# Patient Record
Sex: Female | Born: 1995 | Hispanic: Yes | Marital: Single | Smoking: Never smoker
Health system: Southern US, Community
[De-identification: ages and names within clinical notes are randomized; demographics above are authoritative.]

## PROBLEM LIST (undated history)

## (undated) DIAGNOSIS — F419 Anxiety disorder, unspecified: Secondary | ICD-10-CM

## (undated) DIAGNOSIS — E079 Disorder of thyroid, unspecified: Secondary | ICD-10-CM

## (undated) HISTORY — DX: Disorder of thyroid, unspecified: E07.9

## (undated) HISTORY — DX: Anxiety disorder, unspecified: F41.9

---

## 2017-10-08 ENCOUNTER — Emergency Department (HOSPITAL_COMMUNITY): Payer: Self-pay

## 2017-10-08 ENCOUNTER — Emergency Department (HOSPITAL_COMMUNITY)
Admission: EM | Admit: 2017-10-08 | Discharge: 2017-10-08 | Disposition: A | Discharge status: Home / Self Care | Payer: Self-pay | Attending: Emergency Medicine | Admitting: Emergency Medicine

## 2017-10-08 ENCOUNTER — Other Ambulatory Visit: Payer: Self-pay

## 2017-10-08 ENCOUNTER — Encounter (HOSPITAL_COMMUNITY): Payer: Self-pay | Admitting: Emergency Medicine

## 2017-10-08 DIAGNOSIS — J069 Acute upper respiratory infection, unspecified: Secondary | ICD-10-CM | POA: Insufficient documentation

## 2017-10-08 DIAGNOSIS — B9789 Other viral agents as the cause of diseases classified elsewhere: Secondary | ICD-10-CM

## 2017-10-08 MED ORDER — BENZONATATE 100 MG PO CAPS: 100.0000 mg | ORAL_CAPSULE | Freq: Three times a day (TID) | ORAL | 0 refills | Status: DC | PRN

## 2017-10-08 MED ORDER — HYDROCODONE-HOMATROPINE 5-1.5 MG/5ML PO SYRP: 5.0000 mL | ORAL_SOLUTION | Freq: Four times a day (QID) | ORAL | 0 refills | Status: DC | PRN

## 2017-10-08 NOTE — ED Provider Notes (Signed)
[facility] EMERGENCY DEPARTMENT Provider Note   CSN: [id number] Arrival date & time: 10/08/17  0141     History   Chief Complaint Chief Complaint  Patient presents with  . Cough    HPI Erika Stein is a 22 y.o. female.  Patient presents to the emergency department for evaluation of cough.  She has been sick for 4 or 5 days.  She has had some mild nasal congestion as well.  Patient reports that the cough gets worse at night she has been unable to sleep.  She does not have a history of asthma.  She has not had a fever.  No nausea, vomiting or diarrhea.      History reviewed. No pertinent past medical history.  There are no active problems to display for this patient.   History reviewed. No pertinent surgical history.  OB History    No data available       Home Medications    Prior to Admission medications   Medication Sig Start Date End Date Taking? Authorizing Provider  levothyroxine (SYNTHROID, LEVOTHROID) 25 MCG tablet Take 25 mcg by mouth daily before breakfast.   Yes [provider]  benzonatate (TESSALON) 100 MG capsule Take 1 capsule (100 mg total) by mouth 3 (three) times daily as needed for cough. 10/08/17   Nhi Butrum, Canary Brim, MD  HYDROcodone-homatropine (HYCODAN) 5-1.5 MG/5ML syrup Take 5 mLs by mouth every 6 (six) hours as needed for cough. 10/08/17   Gilda Crease, MD    Family History No family history on file.  Social History Social History   Tobacco Use  . Smoking status: Never Smoker  . Smokeless tobacco: Never Used  Substance Use Topics  . Alcohol use: Not on file  . Drug use: Not on file     Allergies   Patient has no allergy information on record.   Review of Systems Review of Systems  Respiratory: Positive for cough.   All other systems reviewed and are negative.    Physical Exam Updated Vital Signs BP 121/60 (BP Location: Left Arm)   Pulse 77   Temp 98.5 F (36.9 C) (Oral)   Resp 16   Wt (P) 48.5 kg  (107 lb)   LMP 08/10/2017 (Within Weeks) Comment: states irregular periods  SpO2 100%   Physical Exam  Constitutional: She is oriented to person, place, and time. She appears well-developed and well-nourished. No distress.  HENT:  Head: Normocephalic and atraumatic.  Right Ear: Hearing normal.  Left Ear: Hearing normal.  Nose: Nose normal.  Mouth/Throat: Oropharynx is clear and moist and mucous membranes are normal.  Eyes: Conjunctivae and EOM are normal. Pupils are equal, round, and reactive to light.  Neck: Normal range of motion. Neck supple.  Cardiovascular: Regular rhythm, S1 normal and S2 normal. Exam reveals no gallop and no friction rub.  No murmur heard. Pulmonary/Chest: Effort normal and breath sounds normal. No respiratory distress. She exhibits no tenderness.  Abdominal: Soft. Normal appearance and bowel sounds are normal. There is no hepatosplenomegaly. There is no tenderness. There is no rebound, no guarding, no tenderness at McBurney's point and negative Murphy's sign. No hernia.  Musculoskeletal: Normal range of motion.  Neurological: She is alert and oriented to person, place, and time. She has normal strength. No cranial nerve deficit or sensory deficit. Coordination normal. GCS eye subscore is 4. GCS verbal subscore is 5. GCS motor subscore is 6.  Skin: Skin is warm, dry and intact. No rash noted. No cyanosis.  Psychiatric: She has a normal mood and affect. Her speech is normal and behavior is normal. Thought content normal.  Nursing note and vitals reviewed.    ED Treatments / Results  Labs (all labs ordered are listed, but only abnormal results are displayed) Labs Reviewed - No data to display  EKG  EKG Interpretation None       Radiology Dg Chest 2 View  Result Date: 10/08/2017 CLINICAL DATA:  22 year old female with cough. EXAM: CHEST - 2 VIEW COMPARISON:  None. FINDINGS: The heart size and mediastinal contours are within normal limits. Both lungs are  clear. The visualized skeletal structures are unremarkable. IMPRESSION: No active cardiopulmonary disease. Electronically Signed   By: Elgie CollardArash  Radparvar M.D.   On: 10/08/2017 03:29    Procedures Procedures (including critical care time)  Medications Ordered in ED Medications - No data to display   Initial Impression / Assessment and Plan / ED Course  I have reviewed the triage vital signs and the nursing notes.  Pertinent labs & imaging results that were available during my care of the patient were reviewed by me and considered in my medical decision making (see chart for details).     Patient presents for evaluation of cough and chest congestion.  Symptoms ongoing for 4 or 5 days.  Chest x-ray is clear.  Lung examination is clear, no evidence of bronchospasm.  She has not had any difficulty breathing, oxygenation is normal.  Will treat symptomatically for upper respiratory infection.  Final Clinical Impressions(s) / ED Diagnoses   Final diagnoses:  Viral URI with cough    ED Discharge Orders        Ordered    HYDROcodone-homatropine (HYCODAN) 5-1.5 MG/5ML syrup  Every 6 hours PRN     10/08/17 0401    benzonatate (TESSALON) 100 MG capsule  3 times daily PRN     10/08/17 0401       Gilda CreasePollina, Zaden Sako J, MD 10/08/17 0401

## 2017-10-08 NOTE — ED Triage Notes (Signed)
Pt C/O cough that started 4 days ago. Pt states it is worse at night.

## 2017-10-16 ENCOUNTER — Encounter: Payer: Self-pay | Admitting: Physician Assistant

## 2017-10-16 ENCOUNTER — Ambulatory Visit: Payer: Self-pay | Admitting: Physician Assistant

## 2017-10-16 VITALS — BP 107/60 | HR 88 | Temp 99.0°F | Ht 58.5 in | Wt 109.8 lb

## 2017-10-16 DIAGNOSIS — E039 Hypothyroidism, unspecified: Secondary | ICD-10-CM

## 2017-10-16 DIAGNOSIS — Z7689 Persons encountering health services in other specified circumstances: Secondary | ICD-10-CM

## 2017-10-16 DIAGNOSIS — R058 Other specified cough: Secondary | ICD-10-CM

## 2017-10-16 DIAGNOSIS — Z13220 Encounter for screening for lipoid disorders: Secondary | ICD-10-CM

## 2017-10-16 DIAGNOSIS — R05 Cough: Secondary | ICD-10-CM

## 2017-10-16 MED ORDER — AZITHROMYCIN 250 MG PO TABS: ORAL_TABLET | ORAL | 0 refills | Status: DC

## 2017-10-16 NOTE — Progress Notes (Signed)
BP 107/60 (BP Location: Right Arm, Patient Position: Sitting, Cuff Size: Normal)   Pulse 88   Temp 99 F (37.2 C)   Ht 4' 10.5" (1.486 m)   Wt 109 lb 12 oz (49.8 kg)   SpO2 99%   BMI 22.55 kg/m    Subjective:    Patient ID: Erika Stein, female    DOB: 01/19/1996, 22 y.o.   MRN: [id number]  HPI: Erika Stein is a 22 y.o. female presenting on 10/16/2017 for New Patient (Initial Visit) and Cough (started almost 3 weeks ago. pt has taken tessalon but states it made her cough worse. pt states she is now taking robitussin which helps a little.)   HPI   Chief Complaint  Patient presents with  . New Patient (Initial Visit)  . Cough    started almost 3 weeks ago. pt has taken tessalon but states it made her cough worse. pt states she is now taking robitussin which helps a little.    Pt [facility] pt [facility] but she wants to transfer to here.   She started on thyroid medication when she was 19 or so.  Pt seen in ER recently and given hycodan and tessalon.  She says the tessalon didn't help and they told her not to use the hycodan.  She has been coughing for over 2 weeks.   CXR report from earlier this week reviewed  Pt has never had a PAP.  She was on OCP in the past but has stopped it.  She is not sexually active  Relevant past medical, surgical, family and social history reviewed and updated as indicated. Interim medical history since our last visit reviewed. Allergies and medications reviewed and updated.  CURRENT MEDS:  Levothyroxine 25mcg  Review of Systems  Constitutional: Positive for appetite change and fatigue. Negative for chills, diaphoresis, fever and unexpected weight change.  HENT: Positive for congestion, ear pain, sneezing, sore throat and trouble swallowing. Negative for dental problem, drooling, facial swelling, hearing loss, mouth sores and voice change.   Eyes: Negative for pain, discharge, redness, itching and visual disturbance.  Respiratory: Positive for  choking. Negative for cough, shortness of breath and wheezing.   Cardiovascular: Positive for chest pain. Negative for palpitations and leg swelling.  Gastrointestinal: Positive for abdominal pain. Negative for blood in stool, constipation, diarrhea and vomiting.  Endocrine: Negative for cold intolerance, heat intolerance and polydipsia.  Genitourinary: Negative for decreased urine volume, dysuria and hematuria.  Musculoskeletal: Positive for back pain. Negative for arthralgias and gait problem.  Skin: Negative for rash.  Allergic/Immunologic: Negative for environmental allergies.  Neurological: Negative for seizures, syncope, light-headedness and headaches.  Hematological: Negative for adenopathy.  Psychiatric/Behavioral: Negative for agitation, dysphoric mood and suicidal ideas. The patient is not nervous/anxious.     Per HPI unless specifically indicated above     Objective:    BP 107/60 (BP Location: Right Arm, Patient Position: Sitting, Cuff Size: Normal)   Pulse 88   Temp 99 F (37.2 C)   Ht 4' 10.5" (1.486 m)   Wt 109 lb 12 oz (49.8 kg)   SpO2 99%   BMI 22.55 kg/m   Wt Readings from Last 3 Encounters:  10/16/17 109 lb 12 oz (49.8 kg)  10/08/17 (P) 107 lb (48.5 kg)    Physical Exam  Constitutional: She is oriented to person, place, and time. She appears well-developed and well-nourished. No distress.  HENT:  Head: Normocephalic and atraumatic.  Right Ear: Hearing, tympanic membrane, external ear  and ear canal normal.  Left Ear: Hearing, tympanic membrane, external ear and ear canal normal.  Nose: Rhinorrhea present. No mucosal edema.  Mouth/Throat: Uvula is midline and oropharynx is clear and moist. No oropharyngeal exudate.  Eyes: Pupils are equal, round, and reactive to light. Conjunctivae and EOM are normal.  Neck: Neck supple. No thyromegaly present.  Cardiovascular: Normal rate and regular rhythm.  Pulmonary/Chest: Effort normal and breath sounds normal. No  respiratory distress. She has no decreased breath sounds. She has no wheezes. She has no rhonchi. She has no rales.  Abdominal: Soft. Bowel sounds are normal. She exhibits no mass. There is no hepatosplenomegaly. There is no tenderness.  Musculoskeletal: She exhibits no edema.  Lymphadenopathy:    She has no cervical adenopathy.  Neurological: She is alert and oriented to person, place, and time. Gait normal.  Skin: Skin is warm and dry.  Psychiatric: She has a normal mood and affect. Her behavior is normal.  Vitals reviewed.   No results found for this or any previous visit.    Assessment & Plan:   Encounter Diagnoses  Name Primary?  . Encounter to establish care Yes  . Cough present for greater than 3 weeks   . Hypothyroidism, unspecified type   . Screening cholesterol level      -will check baseline labs -pt to continue current levothyroxine -rx zpack -pt counseled to use the hycodan if needed.  Cautioned to not drive while taking it due to sedation.  She can use robitussin or tessalon at other times.  Also recommended benadryl or similar for nasal congestion -pt to follow up in 6 months.  RTO sooner if cough persists or for other issues

## 2017-10-17 ENCOUNTER — Other Ambulatory Visit (HOSPITAL_COMMUNITY)
Admission: RE | Admit: 2017-10-17 | Discharge: 2017-10-17 | Disposition: A | Discharge status: Home / Self Care | Payer: Self-pay | Source: Ambulatory Visit | Attending: Physician Assistant | Admitting: Physician Assistant

## 2017-10-17 DIAGNOSIS — Z13220 Encounter for screening for lipoid disorders: Secondary | ICD-10-CM | POA: Insufficient documentation

## 2017-10-17 DIAGNOSIS — E039 Hypothyroidism, unspecified: Secondary | ICD-10-CM | POA: Insufficient documentation

## 2017-10-17 LAB — LIPID PANEL
CHOLESTEROL: 126 mg/dL (ref 0–200)
HDL: 57 mg/dL (ref >40)
LDL CALC: 57 mg/dL (ref 0–99)
TRIGLYCERIDES: 61 mg/dL (ref <150)
Total CHOL/HDL Ratio: 2.2 RATIO
VLDL: 12 mg/dL (ref 0–40)

## 2017-10-17 LAB — COMPREHENSIVE METABOLIC PANEL WITH GFR
ALT: 16 U/L (ref 14–54)
AST: 16 U/L (ref 15–41)
Albumin: 3.9 g/dL (ref 3.5–5.0)
Alkaline Phosphatase: 83 U/L (ref 38–126)
Anion gap: 11 (ref 5–15)
BUN: 9 mg/dL (ref 6–20)
CO2: 26 mmol/L (ref 22–32)
Calcium: 9 mg/dL (ref 8.9–10.3)
Chloride: 102 mmol/L (ref 101–111)
Creatinine, Ser: 0.6 mg/dL (ref 0.44–1.00)
GFR calc Af Amer: >60 mL/min (ref >60)
GFR calc non Af Amer: >60 mL/min (ref >60)
Glucose, Bld: 92 mg/dL (ref 65–99)
Potassium: 4.7 mmol/L (ref 3.5–5.1)
Sodium: 139 mmol/L (ref 135–145)
Total Bilirubin: 0.7 mg/dL (ref 0.3–1.2)
Total Protein: 7.2 g/dL (ref 6.5–8.1)

## 2017-10-17 LAB — TSH: TSH: 2.644 u[IU]/mL (ref 0.350–4.500)

## 2018-01-15 ENCOUNTER — Other Ambulatory Visit: Payer: Self-pay | Admitting: Physician Assistant

## 2018-01-15 MED ORDER — LEVOTHYROXINE SODIUM 25 MCG PO TABS: ORAL_TABLET | ORAL | 3 refills | Status: DC

## 2018-01-28 ENCOUNTER — Ambulatory Visit: Payer: Self-pay | Admitting: Physician Assistant

## 2018-01-28 ENCOUNTER — Encounter: Payer: Self-pay | Admitting: Physician Assistant

## 2018-01-28 VITALS — BP 91/56 | HR 72 | Temp 97.9°F | Wt 111.2 lb

## 2018-01-28 DIAGNOSIS — J029 Acute pharyngitis, unspecified: Secondary | ICD-10-CM

## 2018-01-28 DIAGNOSIS — L709 Acne, unspecified: Secondary | ICD-10-CM

## 2018-01-28 NOTE — Patient Instructions (Addendum)

## 2018-01-28 NOTE — Progress Notes (Signed)
BP (!) 91/56 (BP Location: Right Arm, Patient Position: Sitting, Cuff Size: Normal)   Pulse 72   Temp 97.9 F (36.6 C)   Wt 111 lb 4 oz (50.5 kg)   SpO2 100%   BMI 22.86 kg/m    Subjective:    Patient ID: Erika Stein, female    DOB: October 16, 1995, 22 y.o.   MRN: [id number]  HPI: Erika Stein is a 22 y.o. female presenting on 01/28/2018 for Sore Throat (began about a week ago. intermittent R ear pain. painful to swollow. pt has not tried anything) and Acne (pt states she usually break out during her period. pt states bumps are more than usual. pt also notices white spots on face)   HPI   Chief Complaint  Patient presents with  . Sore Throat    began about a week ago. intermittent R ear pain. painful to swollow. pt has not tried anything  . Acne    pt states she usually break out during her period. pt states bumps are more than usual. pt also notices white spots on face   No fever. No cough or congestion  Pt notes breaking out past 2 weeks or so.  She thinks its more related to her menses.  LMP June 11.  She used to be on OCP but has been off that about 6 months.  She is currently not sexually active  Relevant past medical, surgical, family and social history reviewed and updated as indicated. Interim medical history since our last visit reviewed. Allergies and medications reviewed and updated.   Current Outpatient Medications:  .  levothyroxine (SYNTHROID, LEVOTHROID) 25 MCG tablet, 1 po qd.  Tome una tableta por boca diaria, Disp: 30 tablet, Rfl: 3   Review of Systems  Constitutional: Positive for appetite change and fatigue. Negative for chills, diaphoresis, fever and unexpected weight change.  HENT: Positive for sore throat. Negative for congestion, dental problem, drooling, ear pain, facial swelling, hearing loss, mouth sores, sneezing, trouble swallowing and voice change.   Eyes: Negative for pain, discharge, redness, itching and visual disturbance.  Respiratory:  Negative for cough, choking, shortness of breath and wheezing.   Cardiovascular: Negative for chest pain, palpitations and leg swelling.  Gastrointestinal: Negative for abdominal pain, blood in stool, constipation, diarrhea and vomiting.  Endocrine: Negative for cold intolerance, heat intolerance and polydipsia.  Genitourinary: Negative for decreased urine volume, dysuria and hematuria.  Musculoskeletal: Negative for arthralgias, back pain and gait problem.  Skin: Negative for rash.  Allergic/Immunologic: Negative for environmental allergies.  Neurological: Negative for seizures, syncope, light-headedness and headaches.  Hematological: Negative for adenopathy.  Psychiatric/Behavioral: Negative for agitation, dysphoric mood and suicidal ideas. The patient is not nervous/anxious.     Per HPI unless specifically indicated above     Objective:    BP (!) 91/56 (BP Location: Right Arm, Patient Position: Sitting, Cuff Size: Normal)   Pulse 72   Temp 97.9 F (36.6 C)   Wt 111 lb 4 oz (50.5 kg)   SpO2 100%   BMI 22.86 kg/m   Wt Readings from Last 3 Encounters:  01/28/18 111 lb 4 oz (50.5 kg)  10/16/17 109 lb 12 oz (49.8 kg)  10/08/17 (P) 107 lb (48.5 kg)    Physical Exam  Constitutional: She is oriented to person, place, and time. She appears well-developed and well-nourished.  HENT:  Head: Normocephalic and atraumatic.  Right Ear: Hearing, tympanic membrane, external ear and ear canal normal.  Left  Ear: Hearing, tympanic membrane, external ear and ear canal normal.  Nose: Nose normal.  Mouth/Throat: Uvula is midline and oropharynx is clear and moist. No oropharyngeal exudate.  Neck: Neck supple.  Cardiovascular: Normal rate and regular rhythm.  Pulmonary/Chest: Effort normal and breath sounds normal. She has no wheezes.  Abdominal: Soft. Bowel sounds are normal. She exhibits no mass. There is no hepatosplenomegaly. There is no tenderness.  Musculoskeletal: She exhibits no edema.   Lymphadenopathy:    She has no cervical adenopathy.  Neurological: She is alert and oriented to person, place, and time.  Skin: Skin is warm and dry.  Some comedones on forehead and cheeks  Psychiatric: She has a normal mood and affect. Her behavior is normal.  Vitals reviewed.       Assessment & Plan:    Encounter Diagnoses  Name Primary?  . Sore throat Yes  . Acne, unspecified acne type     -encouraged warm salt water gargles for sore throat -discussed that since acne just a recent issue and she has tried no OTCs, that is the way to go.  Recommend washing with mild cleanser like cetaphil and an OTC bp like clearasil. -pt to follow up in September as scheduled.  RTO sooner if no improvement

## 2018-01-29 ENCOUNTER — Other Ambulatory Visit: Payer: Self-pay | Admitting: Physician Assistant

## 2018-01-29 DIAGNOSIS — E039 Hypothyroidism, unspecified: Secondary | ICD-10-CM

## 2018-01-29 DIAGNOSIS — Z130 Encounter for screening for diseases of the blood and blood-forming organs and certain disorders involving the immune mechanism: Secondary | ICD-10-CM

## 2018-04-02 ENCOUNTER — Encounter: Payer: Self-pay | Admitting: Physician Assistant

## 2018-04-02 ENCOUNTER — Ambulatory Visit: Payer: Self-pay | Admitting: Physician Assistant

## 2018-04-02 ENCOUNTER — Other Ambulatory Visit (HOSPITAL_COMMUNITY)
Admission: RE | Admit: 2018-04-02 | Discharge: 2018-04-02 | Disposition: A | Discharge status: Home / Self Care | Payer: Self-pay | Source: Ambulatory Visit | Attending: Physician Assistant | Admitting: Physician Assistant

## 2018-04-02 VITALS — BP 99/56 | HR 56 | Temp 97.8°F

## 2018-04-02 DIAGNOSIS — E039 Hypothyroidism, unspecified: Secondary | ICD-10-CM

## 2018-04-02 DIAGNOSIS — Z130 Encounter for screening for diseases of the blood and blood-forming organs and certain disorders involving the immune mechanism: Secondary | ICD-10-CM | POA: Insufficient documentation

## 2018-04-02 DIAGNOSIS — J069 Acute upper respiratory infection, unspecified: Secondary | ICD-10-CM

## 2018-04-02 LAB — HEMOGLOBIN AND HEMATOCRIT, BLOOD
HCT: 39.2 % (ref 36.0–46.0)
Hemoglobin: 12.9 g/dL (ref 12.0–15.0)

## 2018-04-02 LAB — TSH: TSH: 2.328 u[IU]/mL (ref 0.350–4.500)

## 2018-04-02 NOTE — Progress Notes (Signed)
BP (!) 99/56   Pulse (!) 56   Temp 97.8 F (36.6 C)   SpO2 99%    Subjective:    Patient ID: Erika Stein, female    DOB: [birth year], 22 y.o.   MRN: [id number]  HPI: Erika Stein is a 22 y.o. female presenting on 04/02/2018 for No chief complaint on file.   HPI   Pt complains of  ST x 1 week. She is having mucus.  Also congestion and nasal congestion.  She took cough drops and that helped.  No fevers or earache.  No N/V.   No others at home sick.    She is not not working for about past month.    Pt says when she is having a trouble swallowing she feels a big glob of mucus that she spits out.     Other than the recent congestion, she is doing well.   Relevant past medical, surgical, family and social history reviewed and updated as indicated. Interim medical history since our last visit reviewed. Allergies and medications reviewed and updated.   Current Outpatient Medications:  .  levothyroxine (SYNTHROID, LEVOTHROID) 25 MCG tablet, 1 po qd.  Tome una tableta por boca diaria, Disp: 30 tablet, Rfl: 3   Review of Systems  Constitutional: Negative for appetite change, chills, diaphoresis, fatigue, fever and unexpected weight change.  HENT: Positive for congestion, sore throat and trouble swallowing. Negative for dental problem, drooling, ear pain, facial swelling, hearing loss, mouth sores, sneezing and voice change.   Eyes: Negative for pain, discharge, redness, itching and visual disturbance.  Respiratory: Negative for cough, choking, shortness of breath and wheezing.   Cardiovascular: Negative for chest pain, palpitations and leg swelling.  Gastrointestinal: Negative for abdominal pain, blood in stool, constipation, diarrhea and vomiting.  Endocrine: Negative for cold intolerance, heat intolerance and polydipsia.  Genitourinary: Negative for decreased urine volume, dysuria and hematuria.  Musculoskeletal: Negative for arthralgias, back pain and gait problem.  Skin: Negative for  rash.  Allergic/Immunologic: Negative for environmental allergies.  Neurological: Negative for seizures, syncope, light-headedness and headaches.  Hematological: Negative for adenopathy.  Psychiatric/Behavioral: Negative for agitation, dysphoric mood and suicidal ideas. The patient is not nervous/anxious.     Per HPI unless specifically indicated above     Objective:    BP (!) 99/56   Pulse (!) 56   Temp 97.8 F (36.6 C)   SpO2 99%   Wt Readings from Last 3 Encounters:  01/28/18 111 lb 4 oz (50.5 kg)  10/16/17 109 lb 12 oz (49.8 kg)  10/08/17 (P) 107 lb (48.5 kg)    Physical Exam  Constitutional: She is oriented to person, place, and time. She appears well-developed and well-nourished.  HENT:  Head: Normocephalic and atraumatic.  Right Ear: Hearing, tympanic membrane, external ear and ear canal normal.  Left Ear: Hearing, tympanic membrane, external ear and ear canal normal.  Nose: Rhinorrhea present.  Mouth/Throat: Uvula is midline and oropharynx is clear and moist. No trismus in the jaw. No uvula swelling. No oropharyngeal exudate.  Neck: Neck supple.  Cardiovascular: Normal rate and regular rhythm.  Pulmonary/Chest: Effort normal and breath sounds normal. She has no wheezes.  Abdominal: Soft. Bowel sounds are normal. She exhibits no mass. There is no hepatosplenomegaly. There is no tenderness.  Musculoskeletal: She exhibits no edema.  Lymphadenopathy:    She has no cervical adenopathy.  Neurological: She is alert and oriented to person, place, and time.  Skin: Skin is warm and dry.  Psychiatric: She has a normal mood and affect. Her behavior is normal.  Vitals reviewed.       Assessment & Plan:    Encounter Diagnoses  Name Primary?  . Viral upper respiratory tract infection Yes  . Hypothyroidism, unspecified type      -counseled pt on URI and sore throat.  Recommended OTC mucinex, increase fluids, gargles with warm salt water and rest. -will check routine  labs and call pt with results -pt to continue current levothyroxine.   -routine follow up in 1 year.  RTO sooner prn

## 2018-04-02 NOTE — Patient Instructions (Signed)

## 2018-04-20 ENCOUNTER — Ambulatory Visit: Payer: Self-pay | Admitting: Physician Assistant

## 2018-07-08 ENCOUNTER — Other Ambulatory Visit: Payer: Self-pay | Admitting: Physician Assistant

## 2018-11-30 ENCOUNTER — Ambulatory Visit: Payer: Self-pay | Admitting: Physician Assistant

## 2018-11-30 ENCOUNTER — Encounter: Payer: Self-pay | Admitting: Physician Assistant

## 2018-11-30 ENCOUNTER — Other Ambulatory Visit: Payer: Self-pay

## 2018-11-30 VITALS — BP 90/58 | HR 83 | Temp 98.1°F

## 2018-11-30 DIAGNOSIS — L0231 Cutaneous abscess of buttock: Secondary | ICD-10-CM

## 2018-11-30 MED ORDER — SULFAMETHOXAZOLE-TRIMETHOPRIM 800-160 MG PO TABS: 1.0000 | ORAL_TABLET | Freq: Two times a day (BID) | ORAL | 0 refills | Status: AC

## 2018-11-30 NOTE — Patient Instructions (Signed)
a Skin Abscess  A skin abscess is an infected area on or under your skin that contains a collection of pus and other material. An abscess may also be called a furuncle, carbuncle, or boil. An abscess can occur in or on almost any part of your body. Some abscesses break open (rupture) on their own. Most continue to get worse unless they are treated. The infection can spread deeper into the body and eventually into your blood, which can make you feel ill. Treatment usually involves draining the abscess. What are the causes? An abscess occurs when germs, like bacteria, pass through your skin and cause an infection. This may be caused by:  A scrape or cut on your skin.  A puncture wound through your skin, including a needle injection or insect bite.  Blocked oil or sweat glands.  Blocked and infected hair follicles.  A cyst that forms beneath your skin (sebaceous cyst) and becomes infected. What increases the risk? This condition is more likely to develop in people who:  Have a weak body defense system (immune system).  Have diabetes.  Have dry and irritated skin.  Get frequent injections or use illegal IV drugs.  Have a foreign body in a wound, such as a splinter.  Have problems with their lymph system or veins. What are the signs or symptoms? Symptoms of this condition include:  A painful, firm bump under the skin.  A bump with pus at the top. This may break through the skin and drain. Other symptoms include:  Redness surrounding the abscess site.  Warmth.  Swelling of the lymph nodes (glands) near the abscess.  Tenderness.  A sore on the skin. How is this diagnosed? This condition may be diagnosed based on:  A physical exam.  Your medical history.  A sample of pus. This may be used to find out what is causing the infection.  Blood tests.  Imaging tests, such as an ultrasound, CT scan, or MRI. How is this treated? A small abscess that drains on its own may  not need treatment. Treatment for larger abscesses may include:  Moist heat or heat pack applied to the area several times a day.  A procedure to drain the abscess (incision and drainage).  Antibiotic medicines. For a severe abscess, you may first get antibiotics through an IV and then change to antibiotics by mouth. Follow these instructions at home: Medicines   Take over-the-counter and prescription medicines only as told by your health care provider.  If you were prescribed an antibiotic medicine, take it as told by your health care provider. Do not stop taking the antibiotic even if you start to feel better. Abscess care   If you have an abscess that has not drained, apply heat to the affected area. Use the heat source that your health care provider recommends, such as a moist heat pack or a heating pad. ? Place a towel between your skin and the heat source. ? Leave the heat on for 20-30 minutes. ? Remove the heat if your skin turns bright red. This is especially important if you are unable to feel pain, heat, or cold. You may have a greater risk of getting burned.  Follow instructions from your health care provider about how to take care of your abscess. Make sure you: ? Cover the abscess with a bandage (dressing). ? Change your dressing or gauze as told by your health care provider. ? Wash your hands with soap and water before you change  the dressing or gauze. If soap and water are not available, use hand sanitizer.  Check your abscess every day for signs of a worsening infection. Check for: ? More redness, swelling, or pain. ? More fluid or blood. ? Warmth. ? More pus or a bad smell. General instructions  To avoid spreading the infection: ? Do not share personal care items, towels, or hot tubs with others. ? Avoid making skin contact with other people.  Keep all follow-up visits as told by your health care provider. This is important. Contact a health care provider if  you have:  More redness, swelling, or pain around your abscess.  More fluid or blood coming from your abscess.  Warm skin around your abscess.  More pus or a bad smell coming from your abscess.  A fever.  Muscle aches.  Chills or a general ill feeling. Get help right away if you:  Have severe pain.  See red streaks on your skin spreading away from the abscess. Summary  A skin abscess is an infected area on or under your skin that contains a collection of pus and other material.  A small abscess that drains on its own may not need treatment.  Treatment for larger abscesses may include having a procedure to drain the abscess and taking an antibiotic. This information is not intended to replace advice given to you by your health care provider. Make sure you discuss any questions you have with your health care provider. Document Released: 04/24/2005 Document Revised: 08/28/2017 Document Reviewed: 08/28/2017 [employer] Interactive Patient Education  2019 [employer]

## 2018-11-30 NOTE — Progress Notes (Signed)
   BP (!) 90/58   Pulse 83   Temp 98.1 F (36.7 C)   SpO2 98%    Subjective:    Patient ID: Erika Stein, female    DOB: 11-Jul-1996, 23 y.o.   MRN: [id number]  HPI: Erika Stein is a 23 y.o. female presenting on 11/30/2018 for Mass (on R buttuck. started on Friday 11-27-18. pt states she has taken IBU for pain, and used OTC antibiotic cream. pt states no itching, no draining. pt states painful to walk, and to sit, and to touch.)   HPI   Chief Complaint  Patient presents with  . Mass    on R buttuck. started on Friday 11-27-18. pt states she has taken IBU for pain, and used OTC antibiotic cream. pt states no itching, no draining. pt states painful to walk, and to sit, and to touch.     Relevant past medical, surgical, family and social history reviewed and updated as indicated. Interim medical history since our last visit reviewed. Allergies and medications reviewed and updated.   Current Outpatient Medications:  .  levothyroxine (SYNTHROID, LEVOTHROID) 25 MCG tablet, TAKE 1 TABLET BY MOUTH ONCE DAILY TOME  UNA  TABLETA  POR  BOCA  DIARIA, Disp: 90 tablet, Rfl: 1    Review of Systems  Per HPI unless specifically indicated above     Objective:    BP (!) 90/58   Pulse 83   Temp 98.1 F (36.7 C)   SpO2 98%   Wt Readings from Last 3 Encounters:  01/28/18 111 lb 4 oz (50.5 kg)  10/16/17 109 lb 12 oz (49.8 kg)  10/08/17 (P) 107 lb (48.5 kg)    Physical Exam Vitals signs and nursing note reviewed. Exam conducted with a chaperone present.  Constitutional:      General: She is not in acute distress.    Appearance: Normal appearance. She is normal weight. She is not ill-appearing.  HENT:     Head: Normocephalic and atraumatic.  Pulmonary:     Effort: Pulmonary effort is normal. No respiratory distress.  Skin:    General: Skin is warm and dry.     Findings: Abscess present.          Comments: Abscess to R of gluteal cleft  Neurological:     Mental Status: She is alert and  oriented to person, place, and time.  Psychiatric:        Mood and Affect: Mood normal.     Discussed risks and benefits of I&D with pt including pain, scar, infection and she agreed to proceed.  Area was cleaned with hibiclens.  Instilled 1% lidocaine  0.75cc.  Small 69mm incision with #11 blade and copious pus expressed.  1/4 inch plain packing into cavity and dry sterile dressing applied (nurse Berenice assisted)  Pt tolerated procedure well and ambulated from room without assistance        Assessment & Plan:   Encounter Diagnosis  Name Primary?  . Cutaneous abscess of buttock Yes    -rx septra ds bid x 7d -Pt to remove packing tomorrow -She is to keep covered with dry sterile dressing until healed (no ointment) -Follow up as scheduled or RTO sooner prn

## 2018-12-24 ENCOUNTER — Other Ambulatory Visit: Payer: Self-pay | Admitting: Physician Assistant

## 2019-03-17 ENCOUNTER — Other Ambulatory Visit: Payer: Self-pay | Admitting: Physician Assistant

## 2019-03-17 DIAGNOSIS — E039 Hypothyroidism, unspecified: Secondary | ICD-10-CM

## 2019-03-29 ENCOUNTER — Other Ambulatory Visit (HOSPITAL_COMMUNITY)
Admission: RE | Admit: 2019-03-29 | Discharge: 2019-03-29 | Disposition: A | Discharge status: Home / Self Care | Payer: Self-pay | Source: Ambulatory Visit | Attending: Physician Assistant | Admitting: Physician Assistant

## 2019-03-29 DIAGNOSIS — E039 Hypothyroidism, unspecified: Secondary | ICD-10-CM | POA: Insufficient documentation

## 2019-03-29 LAB — TSH: TSH: 2.148 u[IU]/mL (ref 0.350–4.500)

## 2019-04-01 ENCOUNTER — Ambulatory Visit: Payer: Self-pay | Admitting: Physician Assistant

## 2019-04-01 ENCOUNTER — Encounter: Payer: Self-pay | Admitting: Physician Assistant

## 2019-04-01 DIAGNOSIS — Z0000 Encounter for general adult medical examination without abnormal findings: Secondary | ICD-10-CM

## 2019-04-01 DIAGNOSIS — E039 Hypothyroidism, unspecified: Secondary | ICD-10-CM

## 2019-04-01 DIAGNOSIS — Z5320 Procedure and treatment not carried out because of patient's decision for unspecified reasons: Secondary | ICD-10-CM

## 2019-04-01 MED ORDER — LEVOTHYROXINE SODIUM 25 MCG PO TABS: ORAL_TABLET | ORAL | 3 refills | Status: DC

## 2019-04-01 NOTE — Progress Notes (Signed)
   There were no vitals taken for this visit.   Subjective:    Patient ID: Erika Stein, female    DOB: 30-Oct-1995, 23 y.o.   MRN: [id number]  HPI: Elim Peale is a 23 y.o. female presenting on 04/01/2019 for No chief complaint on file.   HPI   This is a telemedicine appointment through [employer] due to coronavirus pandemic  I connected with  Erika Stein on 04/01/19 by a video enabled telemedicine application and verified that I am speaking with the correct person using two identifiers.   I discussed the limitations of evaluation and management by telemedicine. The patient expressed understanding and agreed to proceed.  Pt is at home.  Provider is at office.      Pt Works at a [employer].  She says that is going well although it gets really hot some days.    She says she is doing well and she is not having any problems at this time.   She denies depression, anxiety.      Relevant past medical, surgical, family and social history reviewed and updated as indicated. Interim medical history since our last visit reviewed. Allergies and medications reviewed and updated.   Current Outpatient Medications:  .  EUTHYROX 25 MCG tablet, TAKE 1 TABLET BY MOUTH ONCE DAILY TOME  UNA  TABLETA  POR  BOCA  DIARIA, Disp: 90 tablet, Rfl: 1    Review of Systems  Per HPI unless specifically indicated above     Objective:    There were no vitals taken for this visit.  Wt Readings from Last 3 Encounters:  01/28/18 111 lb 4 oz (50.5 kg)  10/16/17 109 lb 12 oz (49.8 kg)  10/08/17 (P) 107 lb (48.5 kg)    Physical Exam Constitutional:      General: She is not in acute distress.    Appearance: Normal appearance. She is normal weight. She is not ill-appearing.  HENT:     Head: Normocephalic and atraumatic.  Pulmonary:     Effort: Pulmonary effort is normal. No respiratory distress.  Neurological:     Mental Status: She is alert and oriented to person, place, and time.  Psychiatric:      Attention and Perception: Attention normal.        Mood and Affect: Mood normal.        Speech: Speech normal.        Behavior: Behavior is cooperative.        Cognition and Memory: Cognition normal.     Results for orders placed or performed during the hospital encounter of 03/29/19  TSH  Result Value Ref Range   TSH 2.148 0.350 - 4.500 uIU/mL      Assessment & Plan:    Encounter Diagnoses  Name Primary?  . Hypothyroidism, unspecified type Yes  . Well adult health check   . Pap smear of cervix declined        -Reviewed labs with pt -Pt to continue current thyroid medication -Discussed getting PAP with pt and that at 23yo, it is recommended that she start getting them.  Pt wants to wait at this time.  She is not sexuallay active -counseled pt on healthy diet and getting into a habit of regular exercise -pt to follow up 1 year.  She is to contact office sooner prn

## 2019-07-05 ENCOUNTER — Ambulatory Visit: Payer: Self-pay | Admitting: Physician Assistant

## 2019-07-05 ENCOUNTER — Encounter: Payer: Self-pay | Admitting: Physician Assistant

## 2019-07-05 DIAGNOSIS — L0231 Cutaneous abscess of buttock: Secondary | ICD-10-CM

## 2019-07-05 MED ORDER — SULFAMETHOXAZOLE-TRIMETHOPRIM 800-160 MG PO TABS: 1.0000 | ORAL_TABLET | Freq: Two times a day (BID) | ORAL | 0 refills | Status: DC

## 2019-07-05 NOTE — Progress Notes (Signed)
   There were no vitals taken for this visit.   Subjective:    Patient ID: Erika Stein, female    DOB: 03-Aug-1995, 23 y.o.   MRN: [id number]  HPI: Charlaine Utsey is a 23 y.o. female presenting on 07/05/2019 for No chief complaint on file.   HPI  This is a telemedicine appointment through [employer] due to coronavirus pandemic  I connected with  Erika Stein on 07/05/19 by a video enabled telemedicine application and verified that I am speaking with the correct person using two identifiers.   I discussed the limitations of evaluation and management by telemedicine. The patient expressed understanding and agreed to proceed.  Pt is at home.  Provider is at office.   Pt state abscess taht she had drained several months ago started bothering her again 3 or 4 days ago.  No self treatment-  No redness or drainage.   No fevers.     Relevant past medical, surgical, family and social history reviewed and updated as indicated. Interim medical history since our last visit reviewed. Allergies and medications reviewed and updated.   Current Outpatient Medications:  .  levothyroxine (EUTHYROX) 25 MCG tablet, TAKE 1 TABLET BY MOUTH ONCE DAILY TOME  UNA  TABLETA  POR  BOCA  DIARIA, Disp: 90 tablet, Rfl: 3   Review of Systems  Per HPI unless specifically indicated above     Objective:    There were no vitals taken for this visit.  Wt Readings from Last 3 Encounters:  01/28/18 111 lb 4 oz (50.5 kg)  10/16/17 109 lb 12 oz (49.8 kg)  10/08/17 (P) 107 lb (48.5 kg)    Physical Exam Constitutional:      General: She is not in acute distress.    Appearance: Normal appearance. She is not ill-appearing.  HENT:     Head: Normocephalic and atraumatic.  Pulmonary:     Effort: Pulmonary effort is normal. No respiratory distress.  Skin:    Comments: Area at superior end of gluteal cleft (same area as previous abscess).  No redness.  No drainage.  No swelling.  Pt says she seems to feel like it is  marginated, like it has a definite size and shape  Neurological:     Mental Status: She is alert and oriented to person, place, and time.  Psychiatric:        Attention and Perception: Attention normal.        Speech: Speech normal.        Behavior: Behavior is cooperative.           Assessment & Plan:   Encounter Diagnosis  Name Primary?  . Cutaneous abscess of buttock Yes     -discussed with pt that it may need I&D.  Will have trial of antibiotics and heat- Septra ds and Heat (soaks or heating pad- 10-20 minutes 4 times/day) -pt instructed to Call back in 48 hour to let us know if it is not improved and she will be scheduled to come in Thursday mroning for I&d or if it is improving she can let us know.  She is to contact office sooner for worsening.  Pt states understanding and in in agreement with plan

## 2019-12-14 ENCOUNTER — Ambulatory Visit: Payer: Self-pay | Admitting: Physician Assistant

## 2019-12-14 ENCOUNTER — Encounter: Payer: Self-pay | Admitting: Physician Assistant

## 2019-12-14 DIAGNOSIS — U071 COVID-19: Secondary | ICD-10-CM

## 2019-12-14 DIAGNOSIS — R059 Cough, unspecified: Secondary | ICD-10-CM

## 2019-12-14 MED ORDER — BENZONATATE 100 MG PO CAPS: ORAL_CAPSULE | ORAL | 2 refills | Status: DC

## 2019-12-14 NOTE — Progress Notes (Signed)
   There were no vitals taken for this visit.   Subjective:    Patient ID: Erika Stein, female    DOB: June 03, 1996, 24 y.o.   MRN: [id number]  HPI: Erika Stein is a 24 y.o. female presenting on 12/14/2019 for No chief complaint on file.   HPI   This is a telemedicine appointment through [employer] due to coronavirus pandemic.    I connected with  Erika Stein on 12/14/19 by a video enabled telemedicine application and verified that I am speaking with the correct person using two identifiers.   I discussed the limitations of evaluation and management by telemedicine. The patient expressed understanding and agreed to proceed.  Pt is at home.  Provider is at office.    Pt had + covid test on May 5.   Her entire family tested positive.  She says the health department told her she is finished with quarantine.  She is still coughing but says it is just a little bit.  She is planning to return to work on Thursday.  She works at a [employer].    She took theraflu and robittusin.   Pt is concerned that her cough and mild sore throat are due to something besides the covid.   She is not running any fevers.     Relevant past medical, surgical, family and social history reviewed and updated as indicated. Interim medical history since our last visit reviewed. Allergies and medications reviewed and updated.  Review of Systems  Per HPI unless specifically indicated above     Objective:    There were no vitals taken for this visit.  Wt Readings from Last 3 Encounters:  01/28/18 111 lb 4 oz (50.5 kg)  10/16/17 109 lb 12 oz (49.8 kg)  10/08/17 (P) 107 lb (48.5 kg)    Physical Exam Constitutional:      Comments: Exam limited because there was a problem with her [employer] connection so there was no video but just audio.  Pulmonary:     Effort: No respiratory distress.  Neurological:     Mental Status: She is alert and oriented to person, place, and time.  Psychiatric:        Attention and  Perception: Attention normal.        Speech: Speech normal.            Assessment & Plan:    Encounter Diagnoses  Name Primary?  . COVID-19 Yes  . Cough      rx tessalon for cough-  To [facility]- [place]  Recommended Warm salt water gargles.  Also can use cough drops  Recommended she get covid vaccination after 45 days  Discussed that eased mask restrictions as seen on TV do not apply to her as she has not been vaccinated so she should continue to wear a mask whenever she is not at home.  Pt has regular follow up in August.  She is to contact office sooner prn

## 2020-03-24 ENCOUNTER — Other Ambulatory Visit (HOSPITAL_COMMUNITY)
Admission: RE | Admit: 2020-03-24 | Discharge: 2020-03-24 | Disposition: A | Discharge status: Home / Self Care | Payer: Self-pay | Source: Ambulatory Visit | Attending: Physician Assistant | Admitting: Physician Assistant

## 2020-03-24 DIAGNOSIS — E039 Hypothyroidism, unspecified: Secondary | ICD-10-CM | POA: Insufficient documentation

## 2020-03-24 LAB — TSH: TSH: 1.453 u[IU]/mL (ref 0.350–4.500)

## 2020-03-28 ENCOUNTER — Ambulatory Visit: Payer: Self-pay | Admitting: Physician Assistant

## 2020-03-28 ENCOUNTER — Other Ambulatory Visit: Payer: Self-pay

## 2020-03-28 ENCOUNTER — Encounter: Payer: Self-pay | Admitting: Physician Assistant

## 2020-03-28 VITALS — BP 96/60 | HR 54 | Temp 97.7°F

## 2020-03-28 DIAGNOSIS — E039 Hypothyroidism, unspecified: Secondary | ICD-10-CM

## 2020-03-28 MED ORDER — LEVOTHYROXINE SODIUM 25 MCG PO TABS: ORAL_TABLET | ORAL | 3 refills | Status: DC

## 2020-03-28 NOTE — Progress Notes (Signed)
   BP 96/60   Pulse (!) 54   Temp 97.7 F (36.5 C)    Subjective:    Patient ID: Erika Stein, female    DOB: February 17, 1996, 24 y.o.   MRN: [id number]  HPI: Erika Stein is a 24 y.o. female presenting on 03/28/2020 for No chief complaint on file.   HPI    Pt had a negative covid 19 screening questionnaire.   Pt is 24yoF who presents for follow up hypothyrroidism.  She says she is doing well.  She works at [employer].  She has not yet received the covid vaccination.  She has no complaints today.      Relevant past medical, surgical, family and social history reviewed and updated as indicated. Interim medical history since our last visit reviewed. Allergies and medications reviewed and updated.   Current Outpatient Medications:  .  levothyroxine (EUTHYROX) 25 MCG tablet, TAKE 1 TABLET BY MOUTH ONCE DAILY TOME  UNA  TABLETA  POR  BOCA  DIARIA, Disp: 90 tablet, Rfl: 3    Review of Systems  Per HPI unless specifically indicated above     Objective:    BP 96/60   Pulse (!) 54   Temp 97.7 F (36.5 C)   Wt Readings from Last 3 Encounters:  01/28/18 111 lb 4 oz (50.5 kg)  10/16/17 109 lb 12 oz (49.8 kg)  10/08/17 (P) 107 lb (48.5 kg)    Physical Exam Vitals reviewed.  Constitutional:      Appearance: She is well-developed.  HENT:     Head: Normocephalic and atraumatic.  Neck:     Thyroid: No thyroid mass, thyromegaly or thyroid tenderness.  Cardiovascular:     Rate and Rhythm: Normal rate and regular rhythm.  Pulmonary:     Effort: Pulmonary effort is normal.     Breath sounds: Normal breath sounds.  Abdominal:     General: Bowel sounds are normal.     Palpations: Abdomen is soft. There is no mass.     Tenderness: There is no abdominal tenderness.  Musculoskeletal:     Cervical back: Neck supple.  Lymphadenopathy:     Cervical: No cervical adenopathy.  Skin:    General: Skin is warm and dry.  Neurological:     Mental Status: She is alert and oriented to  person, place, and time.  Psychiatric:        Behavior: Behavior normal.     Results for orders placed or performed during the hospital encounter of 03/24/20  TSH  Result Value Ref Range   TSH 1.453 0.350 - 4.500 uIU/mL      Assessment & Plan:   Encounter Diagnosis  Name Primary?  . Hypothyroidism, unspecified type Yes     -reviewed labs with pt -pt to Continue current levothyroxine -encouraged pt to get covid vaccination -pt to Follow up 1 year.  She is to contact office sooner prn

## 2021-03-07 ENCOUNTER — Ambulatory Visit: Payer: Self-pay | Admitting: Physician Assistant

## 2021-03-07 ENCOUNTER — Encounter: Payer: Self-pay | Admitting: Physician Assistant

## 2021-03-07 ENCOUNTER — Other Ambulatory Visit: Payer: Self-pay

## 2021-03-07 VITALS — BP 108/67 | HR 67 | Temp 98.4°F | Wt 103.0 lb

## 2021-03-07 DIAGNOSIS — E049 Nontoxic goiter, unspecified: Secondary | ICD-10-CM

## 2021-03-07 DIAGNOSIS — E039 Hypothyroidism, unspecified: Secondary | ICD-10-CM

## 2021-03-07 NOTE — Patient Instructions (Signed)
Pap Test Why am I having this test? A Pap test, also called a Pap smear, is a screening test to check for signs of: Cancer of the vagina, cervix, and uterus. The cervix is the lower part of the uterus that opens into the vagina. Infection. Changes that may be a sign that cancer is developing (precancerous changes). Women need this test on a regular basis. In general, you should have a Pap test every 3 years until you reach menopause or age 24. Women aged 30-60 may choose to have their Pap test done at the same time as an HPV (human papillomavirus) test every 5 years (instead of every 3 years). Your health care provider may recommend having Pap tests more or less oftendepending on your medical conditions and past Pap test results. What kind of sample is taken?  Your health care provider will collect a sample of cells from the surface of your cervix. This will be done using a small cotton swab, plastic spatula, or brush. This sample is often collected during a pelvic exam, when you are lying on your back on an exam table with feet in footrests (stirrups). In some cases, fluids (secretions) from the cervix or vagina may also be collected. How do I prepare for this test? Be aware of where you are in your menstrual cycle. If you are menstruating on the day of the test, you may be asked to reschedule. You may need to reschedule if you have a known vaginal infection on the day of the test. Follow instructions from your health care provider about: Changing or stopping your regular medicines. Some medicines can cause abnormal test results, such as digitalis and tetracycline. Avoiding douching or taking a bath the day before or the day of the test. Tell a health care provider about: Any allergies you have. All medicines you are taking, including vitamins, herbs, eye drops, creams, and over-the-counter medicines. Any blood disorders you have. Any surgeries you have had. Any medical conditions you  have. Whether you are pregnant or may be pregnant. How are the results reported? Your test results will be reported as either abnormal or normal. A false-positive result can occur. A false positive is incorrect because itmeans that a condition is present when it is not. A false-negative result can occur. A false negative is incorrect because itmeans that a condition is not present when it is. What do the results mean? A normal test result means that you do not have signs of cancer of the vagina,cervix, or uterus. An abnormal result may mean that you have: Cancer. A Pap test by itself is not enough to diagnose cancer. You will have more tests done in this case. Precancerous changes in your vagina, cervix, or uterus. Inflammation of the cervix. An STD (sexually transmitted disease). A fungal infection. A parasite infection. Talk with your health care provider about what your results mean. Questions to ask your health care provider Ask your health care provider, or the department that is doing the test: When will my results be ready? How will I get my results? What are my treatment options? What other tests do I need? What are my next steps? Summary In general, women should have a Pap test every 3 years until they reach menopause or age 24. Your health care provider will collect a sample of cells from the surface of your cervix. This will be done using a small cotton swab, plastic spatula, or brush. In some cases, fluids (secretions) from the cervix or   vagina may also be collected. This information is not intended to replace advice given to you by your health care provider. Make sure you discuss any questions you have with your healthcare provider. Document Revised: 06/27/2020 Document Reviewed: 03/17/2020 Elsevier Patient Education  2022 [employer] Inc.  

## 2021-03-07 NOTE — Progress Notes (Signed)
BP 108/67   Pulse 67   Temp 98.4 F (36.9 C)   Wt 103 lb (46.7 kg)   BMI 21.16 kg/m    Subjective:    Patient ID: Erika Stein, female    DOB: [birth year], 25 y.o.   MRN: [id number]  HPI: Erika Stein is a 25 y.o. female presenting on 03/07/2021 for Hypothyroidism   HPI  Pt had a negative covid 19 screening questionnaire.  Chief Complaint  Patient presents with   Hypothyroidism    Pt is Concerned about her thyroid.  She feels she is having hair loss, difficulty gaining weight.  She works at [employer] and is out in the heat  lot.  She says she has Never had a PAP  She is not currently sexually active but uses condom when she is.   She has not gotten the covid vaccination.     Relevant past medical, surgical, family and social history reviewed and updated as indicated. Interim medical history since our last visit reviewed. Allergies and medications reviewed and updated.   Current Outpatient Medications:    levothyroxine (EUTHYROX) 25 MCG tablet, TAKE 1 TABLET BY MOUTH ONCE DAILY TOME  UNA  TABLETA  POR  BOCA  DIARIA, Disp: 90 tablet, Rfl: 3    Review of Systems  Per HPI unless specifically indicated above     Objective:    BP 108/67   Pulse 67   Temp 98.4 F (36.9 C)   Wt 103 lb (46.7 kg)   BMI 21.16 kg/m   Wt Readings from Last 3 Encounters:  03/07/21 103 lb (46.7 kg)  01/28/18 111 lb 4 oz (50.5 kg)  10/16/17 109 lb 12 oz (49.8 kg)    Physical Exam Vitals reviewed.  Constitutional:      General: She is not in acute distress.    Appearance: She is well-developed and normal weight. She is not ill-appearing.  HENT:     Head: Normocephalic and atraumatic.     Comments: Hair thick and healthy Neck:     Thyroid: Thyromegaly present. No thyroid mass or thyroid tenderness.  Cardiovascular:     Rate and Rhythm: Normal rate and regular rhythm.  Pulmonary:     Effort: Pulmonary effort is normal.     Breath sounds: Normal breath sounds.  Abdominal:      General: Bowel sounds are normal.     Palpations: Abdomen is soft. There is no mass.     Tenderness: There is no abdominal tenderness.  Musculoskeletal:     Cervical back: Neck supple.     Right lower leg: No edema.     Left lower leg: No edema.  Lymphadenopathy:     Cervical: No cervical adenopathy.  Skin:    General: Skin is warm and dry.  Neurological:     Mental Status: She is alert and oriented to person, place, and time.     Motor: No weakness or tremor.     Gait: Gait is intact.     Deep Tendon Reflexes:     Reflex Scores:      Patellar reflexes are 2+ on the right side and 2+ on the left side. Psychiatric:        Behavior: Behavior normal. Behavior is cooperative.          Assessment & Plan:   Encounter Diagnoses  Name Primary?   Hypothyroidism, unspecified type Yes   Enlarged thyroid       -Check tsh.  Pt will be  called with results -Thyroid US -pt is given application for [employer] financial assistance -pt is educated an encouraged to get covid vaccination -discussed recommendations that she get PAP.  Discussed with pt that this office cannot do STD testing or birth control but that we can do her PAP if desired.  If she wants STD testing or birth control, she is encouraged to contact [facility] or a local gyn.  Discussed that she can apply for [facility] medicaid to cover this.  -follow up  1 year

## 2021-03-08 ENCOUNTER — Other Ambulatory Visit (HOSPITAL_COMMUNITY)
Admission: RE | Admit: 2021-03-08 | Discharge: 2021-03-08 | Disposition: A | Discharge status: Home / Self Care | Payer: Self-pay | Source: Ambulatory Visit | Attending: Physician Assistant | Admitting: Physician Assistant

## 2021-03-08 DIAGNOSIS — E039 Hypothyroidism, unspecified: Secondary | ICD-10-CM

## 2021-03-08 DIAGNOSIS — E049 Nontoxic goiter, unspecified: Secondary | ICD-10-CM

## 2021-03-08 LAB — TSH: TSH: 0.91 u[IU]/mL (ref 0.350–4.500)

## 2021-03-13 ENCOUNTER — Ambulatory Visit (HOSPITAL_COMMUNITY)
Admission: RE | Admit: 2021-03-13 | Discharge: 2021-03-13 | Disposition: A | Discharge status: Home / Self Care | Payer: Self-pay | Source: Ambulatory Visit | Attending: Physician Assistant | Admitting: Physician Assistant

## 2021-03-13 ENCOUNTER — Other Ambulatory Visit: Payer: Self-pay

## 2021-03-13 DIAGNOSIS — E039 Hypothyroidism, unspecified: Secondary | ICD-10-CM | POA: Insufficient documentation

## 2021-03-13 DIAGNOSIS — E049 Nontoxic goiter, unspecified: Secondary | ICD-10-CM | POA: Insufficient documentation

## 2021-03-28 ENCOUNTER — Ambulatory Visit: Payer: Self-pay | Admitting: Physician Assistant

## 2021-03-28 ENCOUNTER — Encounter: Payer: Self-pay | Admitting: Physician Assistant

## 2021-03-28 DIAGNOSIS — E039 Hypothyroidism, unspecified: Secondary | ICD-10-CM

## 2021-03-28 NOTE — Progress Notes (Signed)
   There were no vitals taken for this visit.   Subjective:    Patient ID: Erika Stein, female    DOB: 02-Sep-1995, 25 y.o.   MRN: [id number]  HPI: Erika Stein is a 25 y.o. female presenting on 03/28/2021 for No chief complaint on file.   HPI This is a telemedicine appointment through [employer].  I connected with  Erika Stein on 03/28/21 by a video enabled telemedicine application and verified that I am speaking with the correct person using two identifiers.   I discussed the limitations of evaluation and management by telemedicine. The patient expressed understanding and agreed to proceed.  Pt is at home.  Provider is at office.  Pt wanted appointment today to discuss her labs.  Pt inquired about diabetes and anemia.   Discussed with her that she was tested for those and there were no current indications to repeat testing at this time.  Pt stated understanding and agreement   Relevant past medical, surgical, family and social history reviewed and updated as indicated. Interim medical history since our last visit reviewed. Allergies and medications reviewed and updated.   Current Outpatient Medications:    levothyroxine (EUTHYROX) 25 MCG tablet, TAKE 1 TABLET BY MOUTH ONCE DAILY TOME  UNA  TABLETA  POR  BOCA  DIARIA, Disp: 90 tablet, Rfl: 3    Review of Systems  Per HPI unless specifically indicated above     Objective:    There were no vitals taken for this visit.  Wt Readings from Last 3 Encounters:  03/07/21 103 lb (46.7 kg)  01/28/18 111 lb 4 oz (50.5 kg)  10/16/17 109 lb 12 oz (49.8 kg)    Physical Exam Constitutional:      General: She is not in acute distress.    Appearance: She is not ill-appearing.  HENT:     Head: Normocephalic and atraumatic.  Pulmonary:     Effort: No respiratory distress.  Neurological:     Mental Status: She is alert and oriented to person, place, and time.  Psychiatric:        Behavior: Behavior normal.    Results for orders placed or  performed during the hospital encounter of 03/08/21  TSH  Result Value Ref Range   TSH 0.910 0.350 - 4.500 uIU/mL      Assessment & Plan:    Encounter Diagnosis  Name Primary?   Hypothyroidism, unspecified type Yes     Discussed labs as above Pt to continue her levothyroxine an follow up as scheduled

## 2021-07-31 ENCOUNTER — Other Ambulatory Visit: Payer: Self-pay | Admitting: Physician Assistant

## 2021-08-29 ENCOUNTER — Ambulatory Visit
Admission: EM | Admit: 2021-08-29 | Discharge: 2021-08-29 | Disposition: A | Discharge status: Home / Self Care | Payer: Self-pay | Attending: Urgent Care | Admitting: Urgent Care

## 2021-08-29 ENCOUNTER — Encounter: Payer: Self-pay | Admitting: Emergency Medicine

## 2021-08-29 ENCOUNTER — Other Ambulatory Visit: Payer: Self-pay

## 2021-08-29 ENCOUNTER — Ambulatory Visit (INDEPENDENT_AMBULATORY_CARE_PROVIDER_SITE_OTHER): Payer: Self-pay

## 2021-08-29 DIAGNOSIS — M542 Cervicalgia: Secondary | ICD-10-CM

## 2021-08-29 DIAGNOSIS — M25551 Pain in right hip: Secondary | ICD-10-CM

## 2021-08-29 DIAGNOSIS — R0781 Pleurodynia: Secondary | ICD-10-CM

## 2021-08-29 DIAGNOSIS — M546 Pain in thoracic spine: Secondary | ICD-10-CM

## 2021-08-29 DIAGNOSIS — M62838 Other muscle spasm: Secondary | ICD-10-CM

## 2021-08-29 DIAGNOSIS — R0782 Intercostal pain: Secondary | ICD-10-CM

## 2021-08-29 MED ORDER — NAPROXEN 375 MG PO TABS: 375.0000 mg | ORAL_TABLET | Freq: Two times a day (BID) | ORAL | 0 refills | Status: DC

## 2021-08-29 MED ORDER — TIZANIDINE HCL 4 MG PO TABS: 4.0000 mg | ORAL_TABLET | Freq: Every day | ORAL | 0 refills | Status: DC
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
## 2021-11-21 ENCOUNTER — Ambulatory Visit: Payer: Self-pay | Admitting: Physician Assistant

## 2021-11-21 ENCOUNTER — Encounter: Payer: Self-pay | Admitting: Physician Assistant

## 2021-11-21 DIAGNOSIS — R051 Acute cough: Secondary | ICD-10-CM

## 2021-11-21 DIAGNOSIS — J069 Acute upper respiratory infection, unspecified: Secondary | ICD-10-CM

## 2021-11-21 MED ORDER — BENZONATATE 100 MG PO CAPS: ORAL_CAPSULE | ORAL | 2 refills | Status: DC

## 2021-11-21 MED ORDER — AZITHROMYCIN 250 MG PO TABS: ORAL_TABLET | ORAL | 0 refills | Status: AC

## 2021-11-21 NOTE — Progress Notes (Signed)
? ?  There were no vitals taken for this visit.  ? ?Subjective:  ? ? Patient ID: Erika Stein, female    DOB: 1995-11-28, 26 y.o.   MRN: [id number] ? ?HPI: ?Erika Stein is a 26 y.o. female presenting on 11/21/2021 for No chief complaint on file. ? ? ?HPI ? ?This is a telemedicine appointment through [employer] ? ?I connected with  Erika Stein on 11/21/21 by a video enabled telemedicine application and verified that I am speaking with the correct person using two identifiers. ?  ?I discussed the limitations of evaluation and management by telemedicine. The patient expressed understanding and agreed to proceed. ? ?Pt is at home.  Provider is at office. ? ? ?Pt has been Sick x 1 week with Joint pains then high fever and Fatigue.  She Took apap and rested.  She has been drinking Hot tea with lemon.  She Got flu test and covid test at [facility]; both were negative.  That was maybe Friday last week. ? ?She says she is still having a Lot of cough and drainage.  The cough seems worse.  She has been On robittussin.  Robittussin was helping at first but not now.  She is having Trouble sleeping last night due to the coughing. ? 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 ? ? ? ? ? ? ?Relevant past medical, surgical, family and social history reviewed and updated as indicated. Interim medical history since our last visit reviewed. ?Allergies and medications reviewed and updated. ? ?Review of Systems ? ?Per HPI unless specifically indicated above ? ?   ?Objective:  ?  ?There were no vitals taken for this visit.  ?Wt Readings from Last 3 Encounters:  ?08/29/21 108 lb (49 kg)  ?03/07/21 103 lb (46.7 kg)  ?01/28/18 111 lb 4 oz (50.5 kg)  ?  ?Physical Exam ?Constitutional:   ?   General: She is not in acute distress. ?   Appearance: She is not toxic-appearing.  ?HENT:  ?   Head: Normocephalic and atraumatic.  ?Pulmonary:  ?   Effort: No respiratory distress.  ?   Comments: Pt is talking in complete sentences  without dyspnea.  She has frequent dry coughing. ?Neurological:  ?   Mental Status: She is alert and oriented to person, place, and time.  ?Psychiatric:     ?   Behavior: Behavior normal.  ? ? ? ? ? ?   ?Assessment & Plan:  ? ? ? ?Encounter Diagnoses  ?Name Primary?  ? Upper respiratory tract infection, unspecified type Yes  ? Acute cough   ? ? ?-Rx tessalon and zpack ?-rest, fluids.  Work note will be sent to her email ?-pt counseled to Get covid vaccination after she gets well ?-pt counseled that she needs to Update enrollment through care connect prior to her next appointment ?-she is to contact office if she doesn't get better ? ? ?

## 2022-02-18 ENCOUNTER — Other Ambulatory Visit: Payer: Self-pay | Admitting: Physician Assistant

## 2022-02-18 DIAGNOSIS — E039 Hypothyroidism, unspecified: Secondary | ICD-10-CM

## 2022-03-06 ENCOUNTER — Ambulatory Visit: Payer: Self-pay | Admitting: Physician Assistant

## 2022-03-13 ENCOUNTER — Ambulatory Visit: Payer: Self-pay | Admitting: Physician Assistant

## 2022-03-20 ENCOUNTER — Other Ambulatory Visit (HOSPITAL_COMMUNITY)
Admission: RE | Admit: 2022-03-20 | Discharge: 2022-03-20 | Disposition: A | Discharge status: Home / Self Care | Payer: Self-pay | Source: Ambulatory Visit | Attending: Physician Assistant | Admitting: Physician Assistant

## 2022-03-20 ENCOUNTER — Ambulatory Visit: Payer: Self-pay | Admitting: Physician Assistant

## 2022-03-20 DIAGNOSIS — E039 Hypothyroidism, unspecified: Secondary | ICD-10-CM | POA: Insufficient documentation

## 2022-03-20 LAB — TSH: TSH: 1.5 u[IU]/mL (ref 0.350–4.500)

## 2022-04-04 ENCOUNTER — Ambulatory Visit: Payer: Self-pay | Admitting: Physician Assistant

## 2022-04-10 ENCOUNTER — Ambulatory Visit: Payer: Self-pay | Admitting: Physician Assistant

## 2022-04-16 ENCOUNTER — Encounter: Payer: Self-pay | Admitting: Physician Assistant

## 2022-04-16 ENCOUNTER — Ambulatory Visit: Payer: Self-pay | Admitting: Physician Assistant

## 2022-04-16 VITALS — BP 100/62 | HR 70 | Temp 98.5°F | Wt 114.6 lb

## 2022-04-16 DIAGNOSIS — Z0000 Encounter for general adult medical examination without abnormal findings: Secondary | ICD-10-CM

## 2022-04-16 DIAGNOSIS — E039 Hypothyroidism, unspecified: Secondary | ICD-10-CM

## 2022-04-16 MED ORDER — LEVOTHYROXINE SODIUM 25 MCG PO TABS: ORAL_TABLET | ORAL | 6 refills | Status: AC

## 2022-04-16 NOTE — Progress Notes (Signed)
BP 100/62   Pulse 70   Temp 98.5 F (36.9 C)   Wt 114 lb 9.6 oz (52 kg)   SpO2 98%   BMI 23.15 kg/m    Subjective:    Patient ID: Erika Stein, female    DOB: September 05, 1995, 26 y.o.   MRN: [id number]  HPI: Erika Stein is a 26 y.o. female presenting on 04/16/2022 for Thyroid Problem   HPI   She is working at the [employer]. She had pap at [facility].  She is using Nuvaring. She is feeling well and has no complaints    Relevant past medical, surgical, family and social history reviewed and updated as indicated. Interim medical history since our last visit reviewed. Allergies and medications reviewed and updated.   Current Outpatient Medications:    etonogestrel-ethinyl estradiol (NUVARING) 0.12-0.015 MG/24HR vaginal ring, Place 1 each vaginally every 28 (twenty-eight) days. Insert vaginally and leave in place for 3 consecutive weeks, then remove for 1 week., Disp: , Rfl:    levothyroxine (SYNTHROID) 25 MCG tablet, TAKE 1 TABLET BY MOUTH ONCE DAILY *TOME UNA TABLETA POR BOCA DIARIA*, Disp: 30 tablet, Rfl: 8    Review of Systems  Per HPI unless specifically indicated above     Objective:    BP 100/62   Pulse 70   Temp 98.5 F (36.9 C)   Wt 114 lb 9.6 oz (52 kg)   SpO2 98%   BMI 23.15 kg/m   Wt Readings from Last 3 Encounters:  04/16/22 114 lb 9.6 oz (52 kg)  08/29/21 108 lb (49 kg)  03/07/21 103 lb (46.7 kg)    Physical Exam Vitals reviewed.  Constitutional:      General: She is not in acute distress.    Appearance: She is well-developed and normal weight. She is not ill-appearing or toxic-appearing.  HENT:     Head: Normocephalic and atraumatic.  Neck:     Thyroid: No thyroid mass, thyromegaly or thyroid tenderness.  Cardiovascular:     Rate and Rhythm: Normal rate and regular rhythm.  Pulmonary:     Effort: Pulmonary effort is normal.     Breath sounds: Normal breath sounds.  Abdominal:     General: Bowel sounds are normal.     Palpations: Abdomen is  soft. There is no mass.     Tenderness: There is no abdominal tenderness.  Musculoskeletal:     Cervical back: Neck supple. No tenderness.     Right lower leg: No edema.     Left lower leg: No edema.  Lymphadenopathy:     Cervical: No cervical adenopathy.  Skin:    General: Skin is warm and dry.  Neurological:     Mental Status: She is alert and oriented to person, place, and time.  Psychiatric:        Behavior: Behavior normal.     Results for orders placed or performed during the hospital encounter of 03/20/22  TSH  Result Value Ref Range   TSH 1.500 0.350 - 4.500 uIU/mL      Assessment & Plan:   Encounter Diagnoses  Name Primary?   Encounter for annual health examination Yes   Hypothyroidism, unspecified type      -reviewed labs with pt -pt to contintue current levothyroxine -pt reminded that enrollment expired last month (03/19/22) and needs to be updated.  She is given the contact information for [facility] connect and is encouraged to get that done sooner -pt was offered flu shot appointment -pt was encouraged  to get the updated covid vax -encouraged healthy diet and regular exercise -pt to follow up 1 year.  She is to contact office sooner prn

## 2022-07-11 ENCOUNTER — Encounter: Payer: Self-pay | Admitting: Physician Assistant

## 2022-07-11 ENCOUNTER — Ambulatory Visit: Payer: Self-pay | Admitting: Physician Assistant

## 2022-07-11 DIAGNOSIS — J069 Acute upper respiratory infection, unspecified: Secondary | ICD-10-CM

## 2022-07-11 DIAGNOSIS — R051 Acute cough: Secondary | ICD-10-CM

## 2022-07-11 MED ORDER — BENZONATATE 100 MG PO CAPS: 100.0000 mg | ORAL_CAPSULE | Freq: Two times a day (BID) | ORAL | 2 refills | Status: DC | PRN

## 2022-07-11 NOTE — Progress Notes (Signed)
   There were no vitals taken for this visit.   Subjective:    Patient ID: Erika Stein, female    DOB: 03-Jan-1996, 26 y.o.   MRN: [id number]  HPI: Erika Stein is a 26 y.o. female presenting on 07/11/2022 for No chief complaint on file.   HPI  This is a telemedicine appointment through [employer]  I connected with  Erika Stein on 07/11/22 by a video enabled telemedicine application and verified that I am speaking with the correct person using two identifiers.   I discussed the limitations of evaluation and management by telemedicine. The patient expressed understanding and agreed to proceed.  Pt is at work.   Provider is at office.     Pt went to Urgent Care on 07/09/22 [facility] in [place] next to the [place]).  She says he had covid and flu tests which were both negative.   She was given rx for bromophed and benzonate.  She says the bromophed helped her congestion but seemed to make her cough more.  She says the benzonatate helps her cough.  She thinks she has only 2 doses more of it.  She is concerned that she needs an antibiotic for her scratchy throat.    Pt is at work today.  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     Relevant past medical, surgical, family and social history reviewed and updated as indicated. Interim medical history since our last visit reviewed. Allergies and medications reviewed and updated.  Review of Systems  Per HPI unless specifically indicated above     Objective:    There were no vitals taken for this visit.  Wt Readings from Last 3 Encounters:  04/16/22 114 lb 9.6 oz (52 kg)  08/29/21 108 lb (49 kg)  03/07/21 103 lb (46.7 kg)    Physical Exam Constitutional:      General: She is not in acute distress.    Appearance: She is not toxic-appearing.  HENT:     Head: Normocephalic and atraumatic.  Pulmonary:     Effort: Pulmonary effort is normal. No respiratory  distress.     Comments: Pt is talking in complete sentences without dyspnea Neurological:     Mental Status: She is oriented to person, place, and time.  Psychiatric:        Behavior: Behavior normal.           Assessment & Plan:     Encounter Diagnoses  Name Primary?   Upper respiratory tract infection, unspecified type Yes   Acute cough     -educated pt that her sore throat does not need antibiotic, that her throat is irritated from her virus and her coughing.  Recommended she gargle with warm salt water every 2-3 hours.  She can use the bromophed if desired.  Refill sent for tessalon.   -pt was encouraged to rest at home and we would provide note for work but she declined stating that she needs to work

## 2022-07-14 IMAGING — US US THYROID
1 series · 14 of 25 positions shown · non-contrast
Comparison: None.

CLINICAL DATA: Hypothyroid. Enlarged thyroid gland

EXAM:
THYROID ULTRASOUND
TECHNIQUE: Ultrasound examination of the thyroid gland and adjacent soft
tissues was performed.

[Series 1: us thyroid · 14 of 44 slices shown]
[im 1/44]
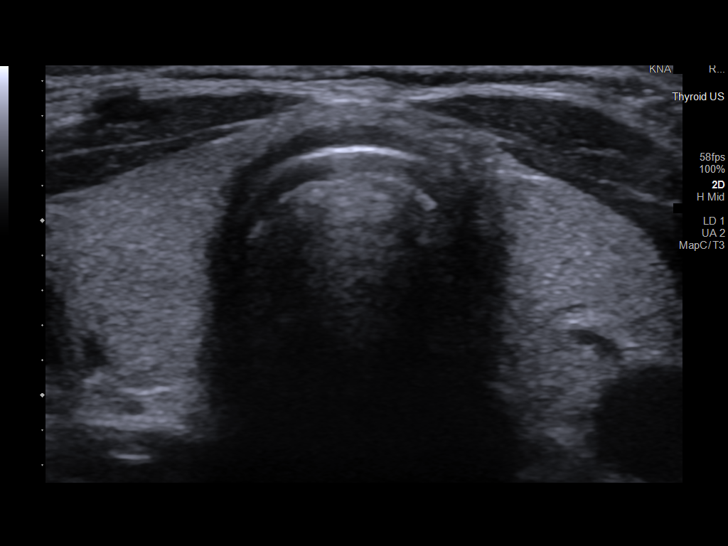
[im 4/44]
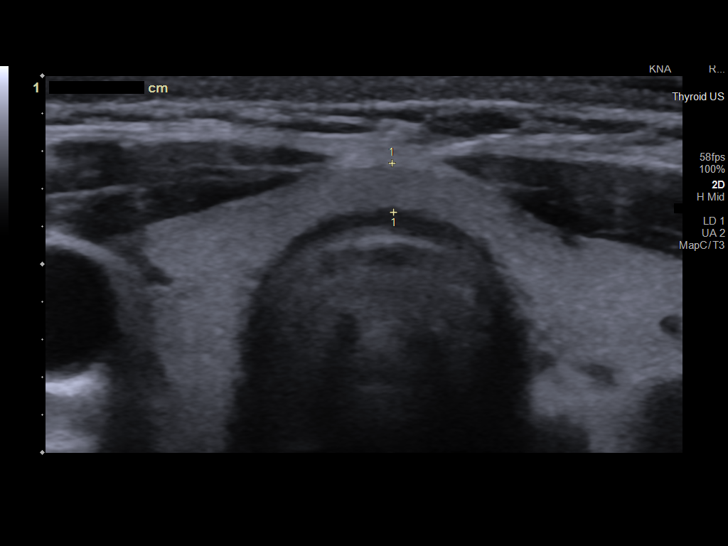
[im 8/44]
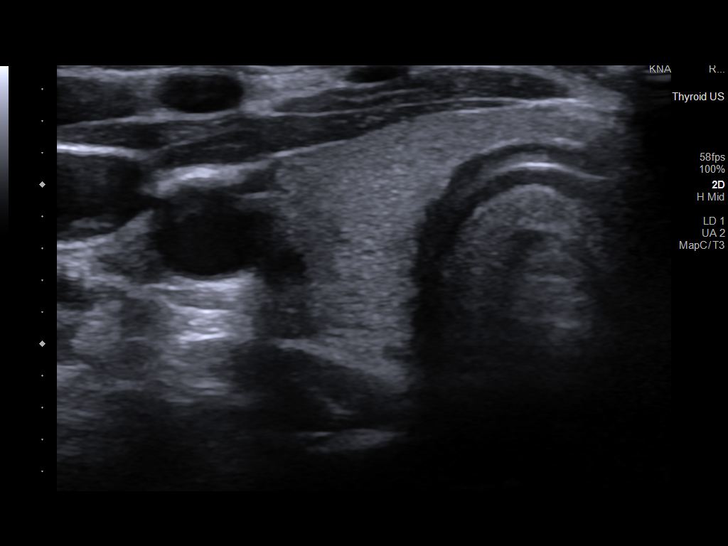
[im 11/44]
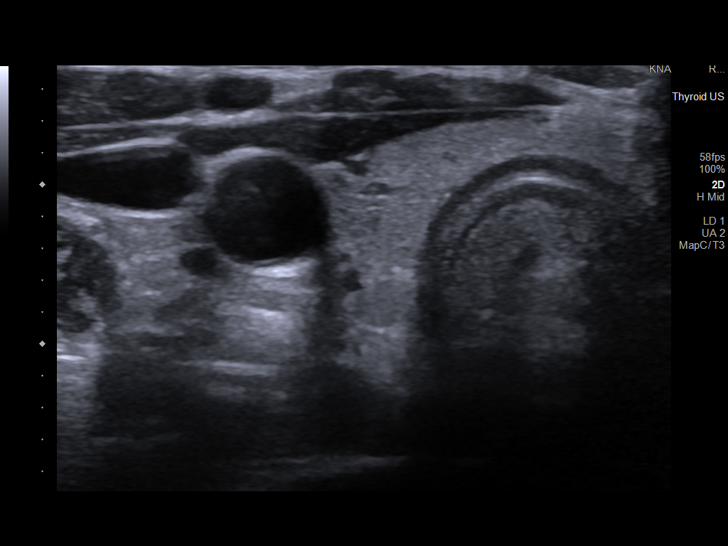
[im 15/44]
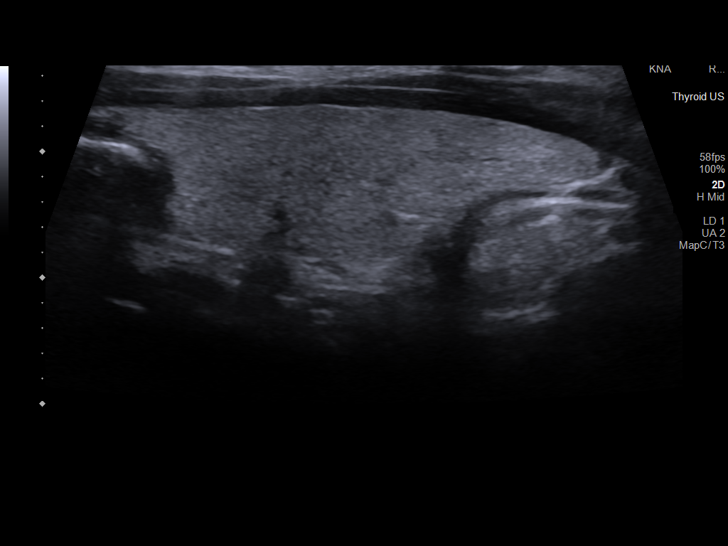
[im 17/44]
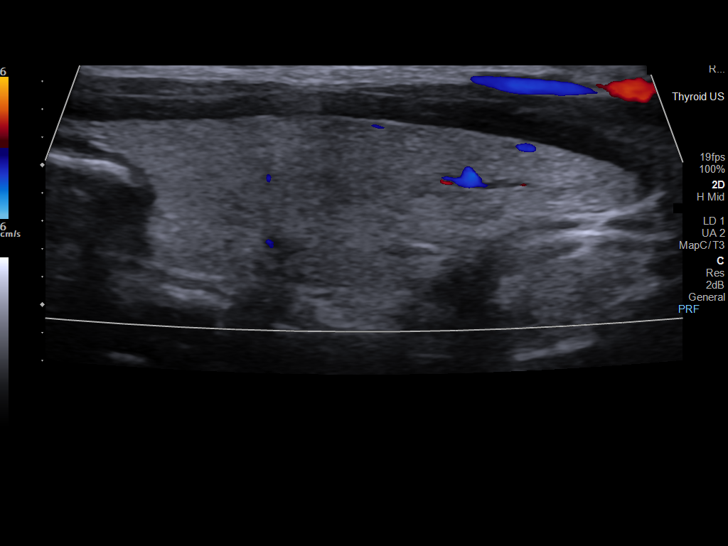
[im 20/44]
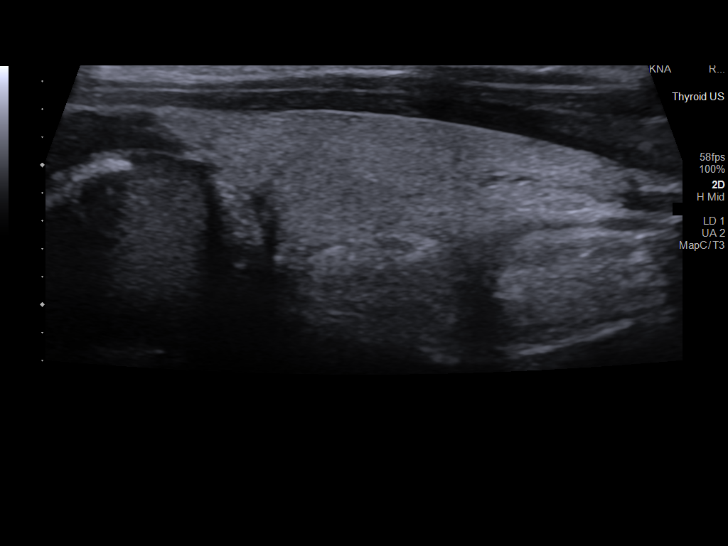
[im 24/44]
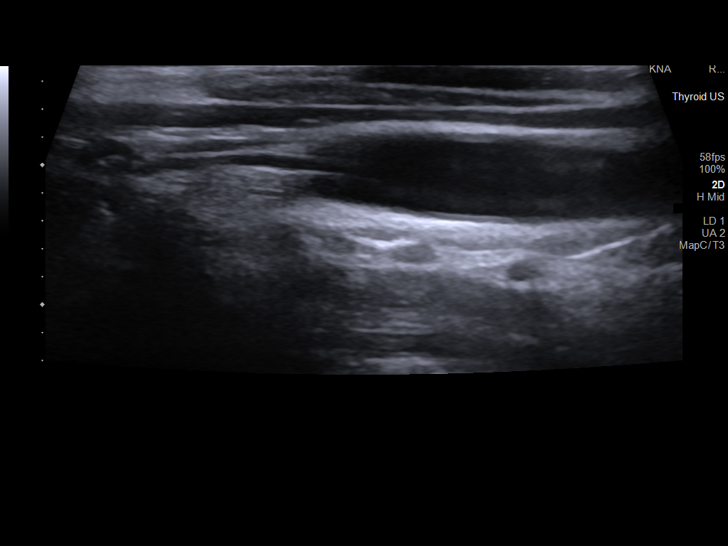
[im 27/44]
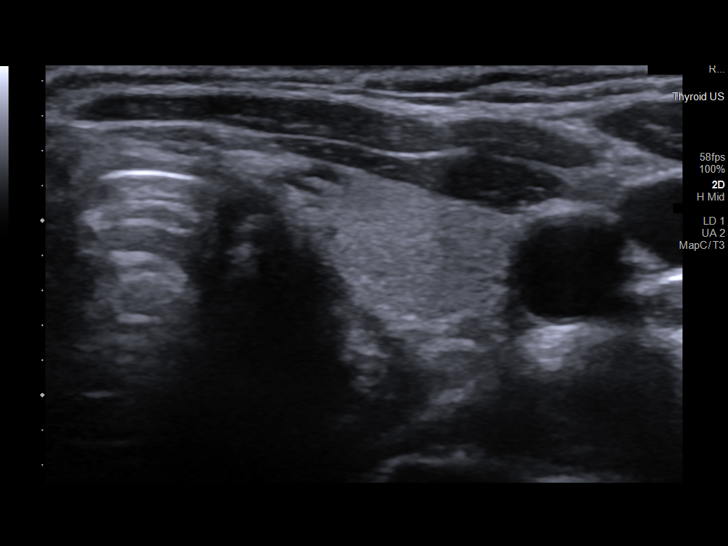
[im 29/44]
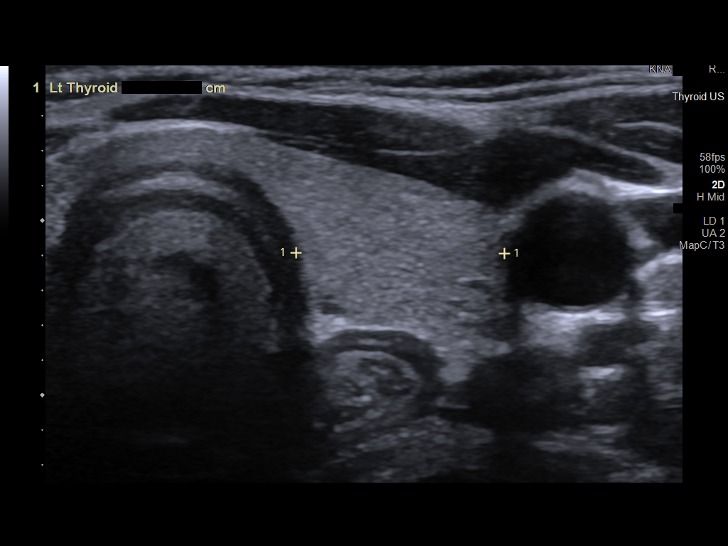
[im 33/44]
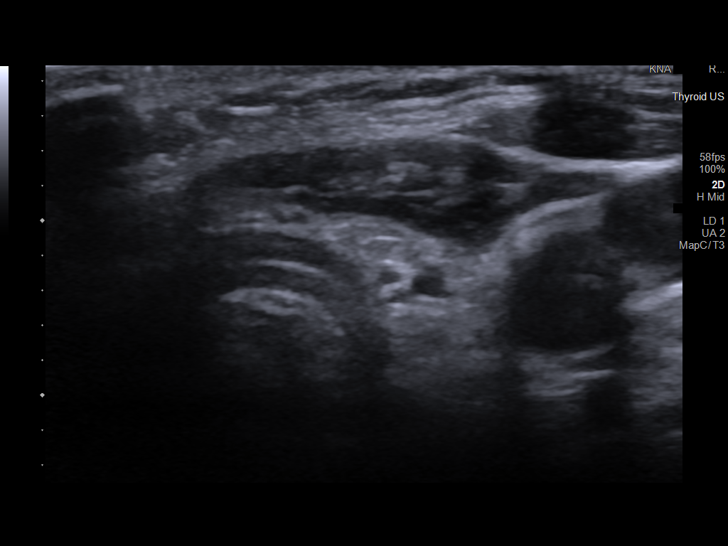
[im 36/44]
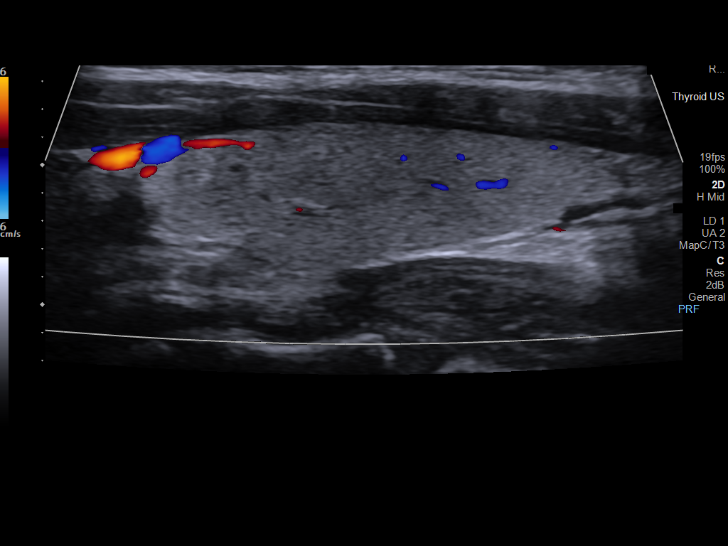
[im 40/44]
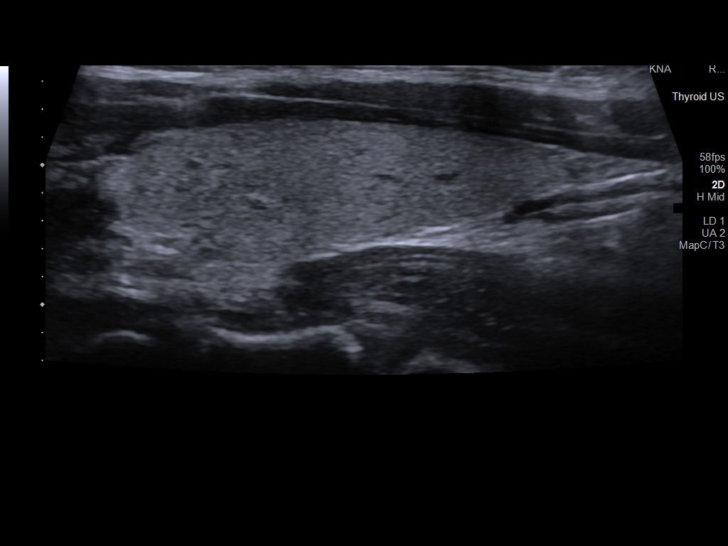
[im 44/44]
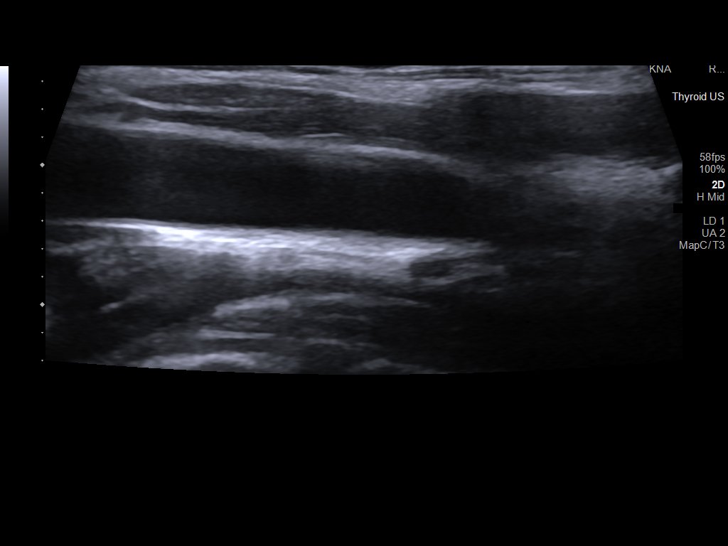

[14 of 25 positions shown; findings below may reference images not displayed]

FINDINGS: Parenchymal Echotexture: Normal.  Normal vascularity.

Isthmus: 0.3 cm

Right lobe: 4.0 x 1.3 x 0.9 cm

Left lobe: 3.5 x 1.0 x 1.2 cm

_________________________________________________________

Estimated total number of nodules >/= 1 cm: 0

Number of spongiform nodules >/=  2 cm not described below (TR1): 0

Number of mixed cystic and solid nodules >/= 1.5 cm not described
below (TR2): 0

_________________________________________________________

No discrete nodules are seen within the thyroid gland.
IMPRESSION: Normal sonographic appearance of the thyroid gland without discrete
nodule identified.

## 2022-12-30 IMAGING — DX DG RIBS W/ CHEST 3+V*R*
3 series · 3 of 3 positions shown · non-contrast
Comparison: None.

CLINICAL DATA: Right rib pain

EXAM:
RIGHT RIBS AND CHEST - 3+ VIEW

[chest pa]
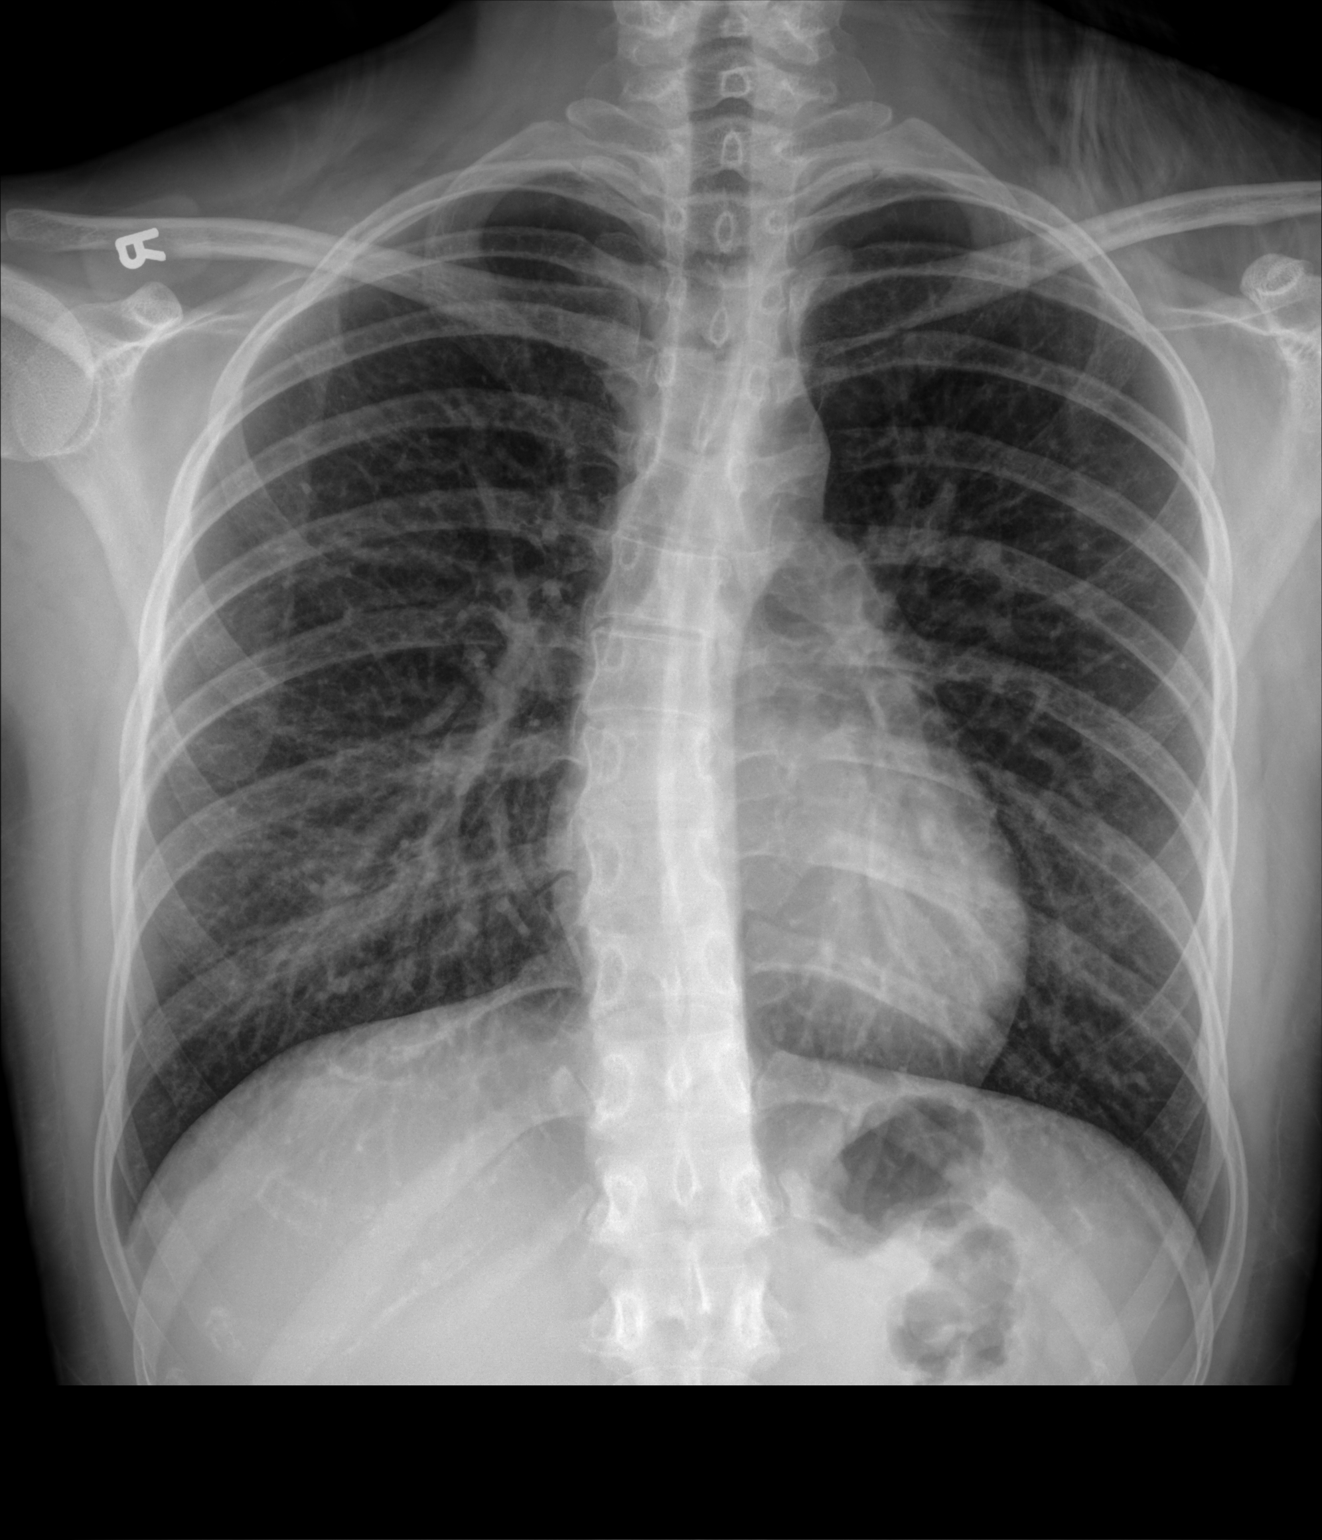

[hemithorax (ribs) ap]
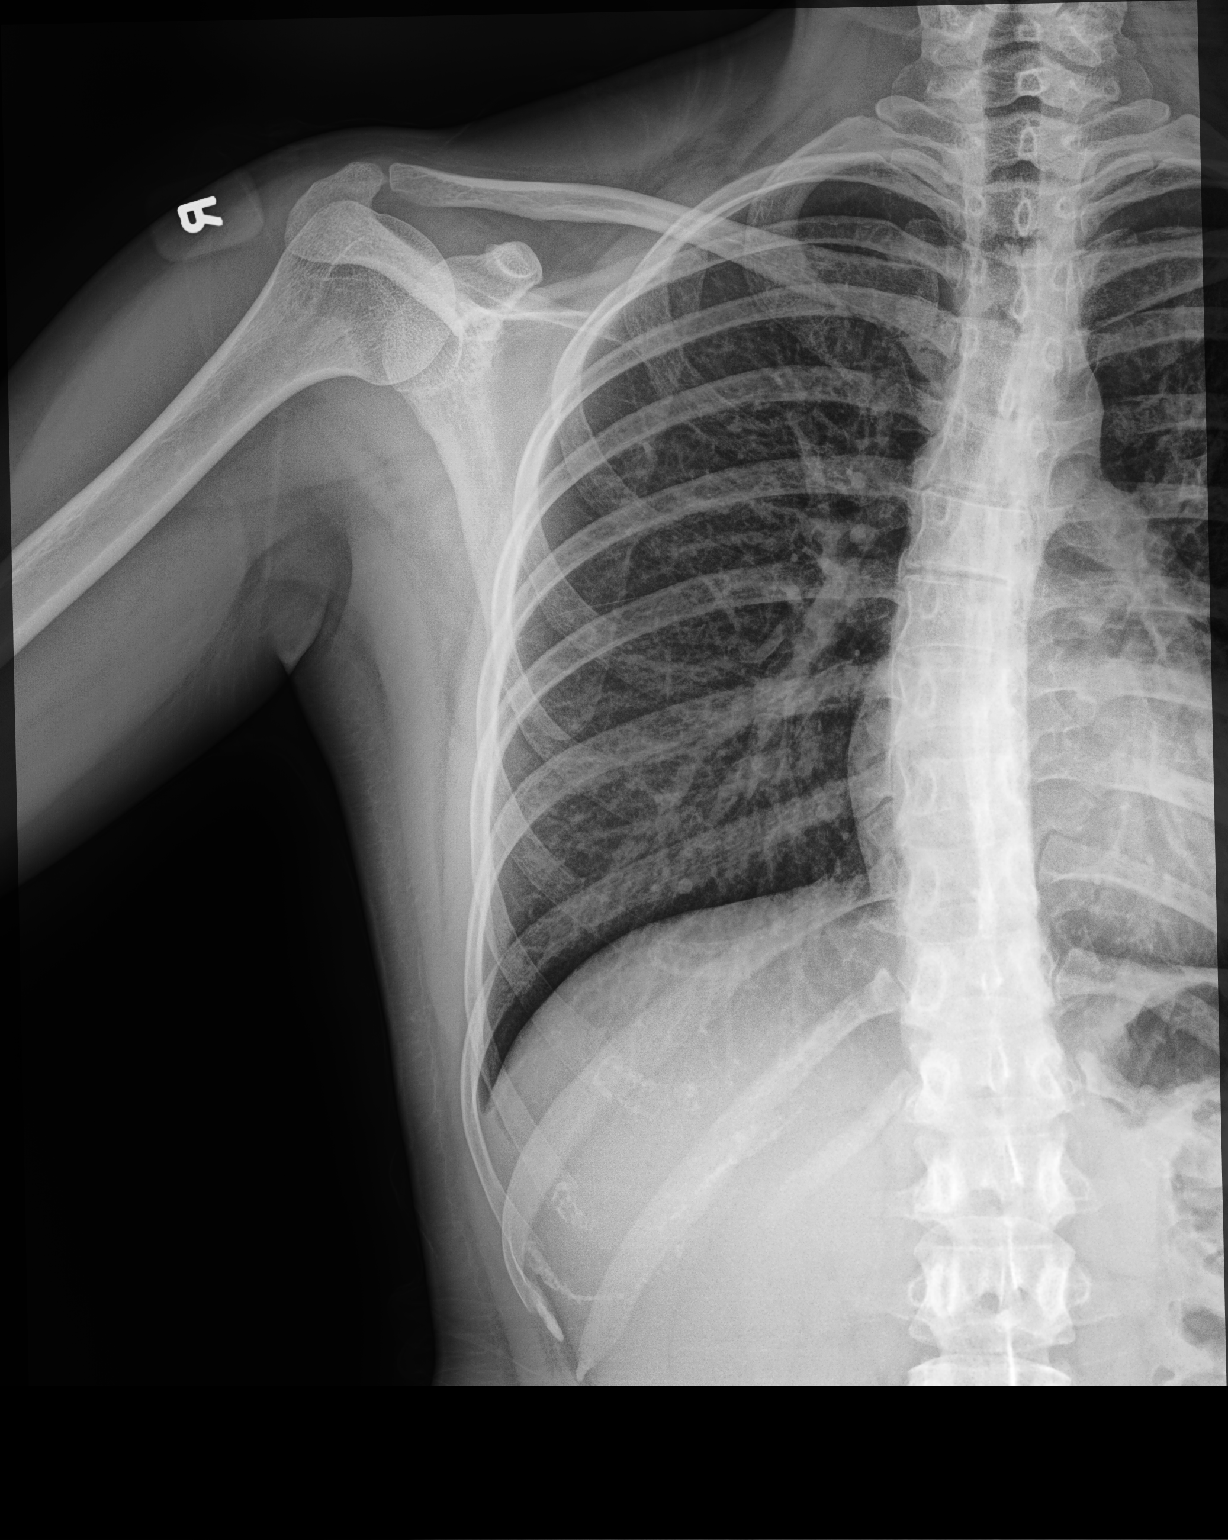

[hemithorax (ribs) mlo]
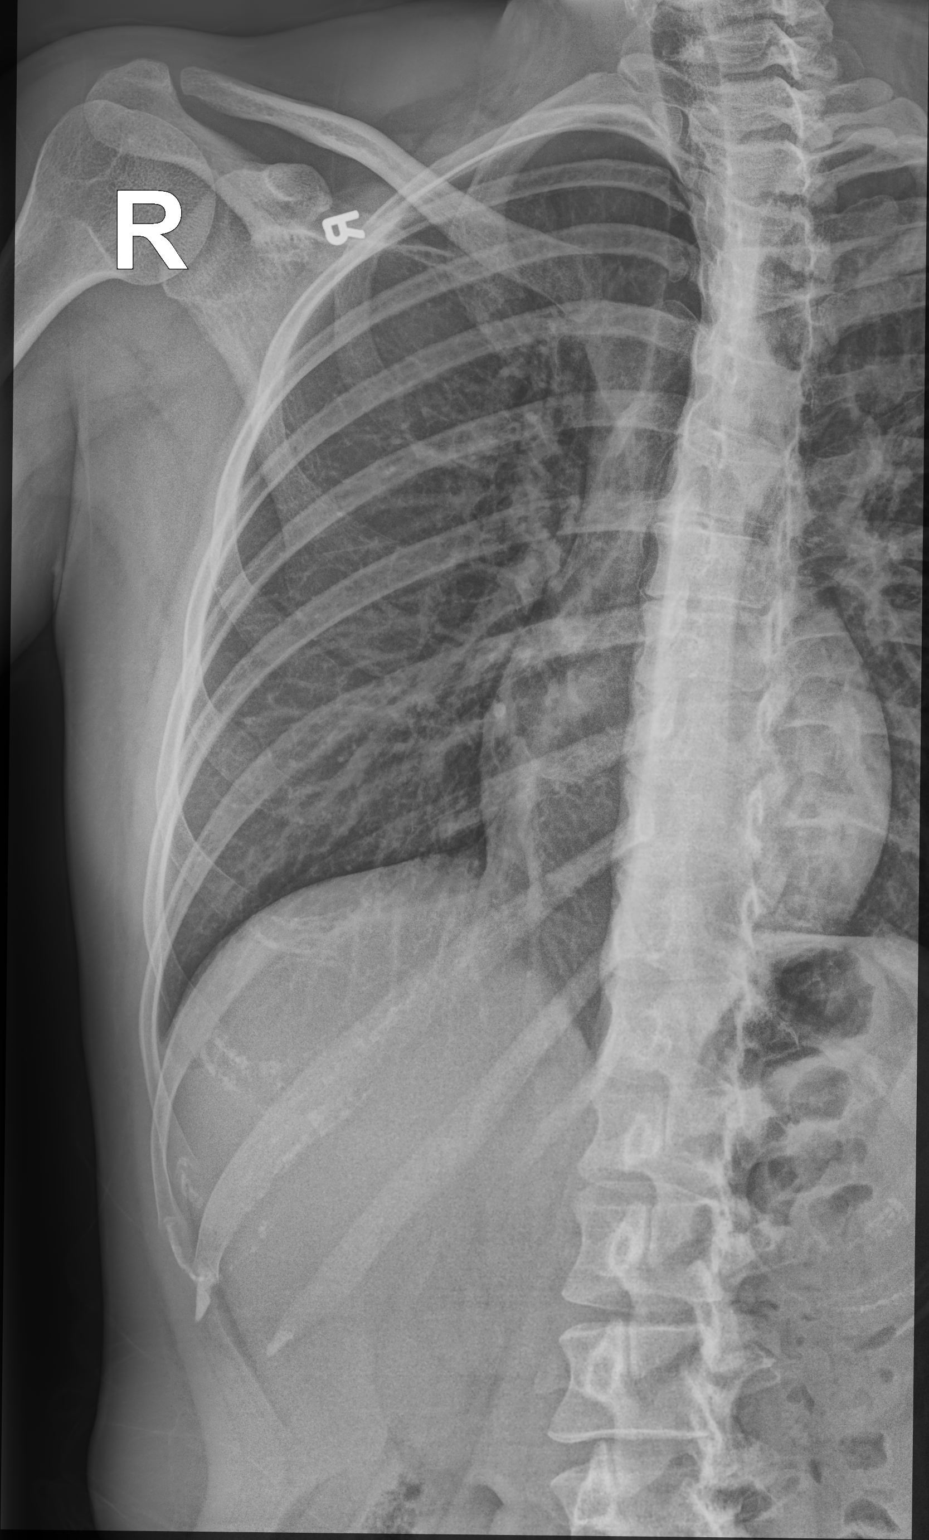

[3 of 3 positions shown; findings below may reference images not displayed]

FINDINGS: The cardiomediastinal silhouette is normal. There is no focal
consolidation or pulmonary edema. There is no pleural effusion or
pneumothorax

No displaced rib fracture or other acute osseous abnormality is
identified. There is mild dextrocurvature of the thoracic spine.
IMPRESSION: No acute rib fracture or other acute osseous abnormality identified.

## 2023-04-21 ENCOUNTER — Ambulatory Visit: Payer: Self-pay | Admitting: Physician Assistant

## 2023-05-14 ENCOUNTER — Other Ambulatory Visit (HOSPITAL_COMMUNITY)
Admission: RE | Admit: 2023-05-14 | Discharge: 2023-05-14 | Disposition: A | Discharge status: Home / Self Care | Payer: Self-pay | Source: Ambulatory Visit | Attending: Physician Assistant | Admitting: Physician Assistant

## 2023-05-14 ENCOUNTER — Encounter: Payer: Self-pay | Admitting: Physician Assistant

## 2023-05-14 ENCOUNTER — Ambulatory Visit: Payer: Self-pay | Admitting: Physician Assistant

## 2023-05-14 VITALS — BP 99/63 | HR 56 | Temp 97.4°F | Wt 119.0 lb

## 2023-05-14 DIAGNOSIS — E039 Hypothyroidism, unspecified: Secondary | ICD-10-CM

## 2023-05-14 DIAGNOSIS — Z0000 Encounter for general adult medical examination without abnormal findings: Secondary | ICD-10-CM

## 2023-05-14 LAB — TSH: TSH: 2.198 u[IU]/mL (ref 0.350–4.500)

## 2023-05-14 NOTE — Progress Notes (Signed)
BP 99/63   Pulse (!) 56   Temp (!) 97.4 F (36.3 C)   Wt 119 lb (54 kg)   BMI 24.04 kg/m    Subjective:    Patient ID: Erika Stein, female    DOB: Dec 04, 1995, 27 y.o.   MRN: [id number]  HPI: Erika Stein is a 27 y.o. female presenting on 05/14/2023 for Annual Exam   HPI   Pt is 27yoF who is in today for annual well check.  She is Still working at the [employer].  She goes to [facility] for her contraception.  They also rx vit d for her.    She says she is doing well.  She Exercises once or twice/week.  She denies depression, anxiety.    she has no complaints.   Relevant past medical, surgical, family and social history reviewed and updated as indicated. Interim medical history since our last visit reviewed. Allergies and medications reviewed and updated.   Current Outpatient Medications:    etonogestrel-ethinyl estradiol (NUVARING) 0.12-0.015 MG/24HR vaginal ring, Place 1 each vaginally every 28 (twenty-eight) days. Insert vaginally and leave in place for 3 consecutive weeks, then remove for 1 week., Disp: , Rfl:    levothyroxine (SYNTHROID) 25 MCG tablet, TAKE 1 TABLET BY MOUTH ONCE DAILY *TOME UNA TABLETA POR BOCA DIARIA*, Disp: 30 tablet, Rfl: 6   VITAMIN D, CHOLECALCIFEROL, PO, Take by mouth., Disp: , Rfl: \   Review of Systems  Per HPI unless specifically indicated above     Objective:    BP 99/63   Pulse (!) 56   Temp (!) 97.4 F (36.3 C)   Wt 119 lb (54 kg)   BMI 24.04 kg/m   Wt Readings from Last 3 Encounters:  05/14/23 119 lb (54 kg)  04/16/22 114 lb 9.6 oz (52 kg)  08/29/21 108 lb (49 kg)    (Unable to check O2 sat due to full artificial nails)  Physical Exam Vitals reviewed.  Constitutional:      General: She is not in acute distress.    Appearance: Normal appearance. She is well-developed and normal weight. She is not ill-appearing.  HENT:     Head: Normocephalic and atraumatic.     Right Ear: Tympanic membrane, ear canal and external ear  normal.     Left Ear: Tympanic membrane, ear canal and external ear normal.     Mouth/Throat:     Mouth: Oropharynx is clear and moist.  Eyes:     Extraocular Movements: Extraocular movements intact and EOM normal.     Conjunctiva/sclera: Conjunctivae normal.     Pupils: Pupils are equal, round, and reactive to light.  Neck:     Thyroid: No thyroid mass, thyromegaly or thyroid tenderness.  Cardiovascular:     Rate and Rhythm: Normal rate and regular rhythm.     Heart sounds: No murmur heard. Pulmonary:     Effort: Pulmonary effort is normal. No respiratory distress.     Breath sounds: Normal breath sounds. No stridor. No wheezing or rhonchi.  Abdominal:     General: Bowel sounds are normal.     Palpations: Abdomen is soft. There is no hepatomegaly, splenomegaly, mass or pulsatile mass.     Tenderness: There is no abdominal tenderness. There is no guarding or rebound.  Musculoskeletal:        General: No edema.     Right shoulder: Normal range of motion.     Left shoulder: Normal range of motion.     Right  elbow: Normal range of motion.     Left elbow: Normal range of motion.     Right hand: Normal.     Left hand: Normal.     Cervical back: Neck supple.     Right lower leg: No edema.     Left lower leg: No edema.  Lymphadenopathy:     Cervical: No cervical adenopathy.  Skin:    General: Skin is warm and dry.  Neurological:     Mental Status: She is alert and oriented to person, place, and time.     Motor: No weakness or tremor.     Coordination: Romberg sign negative. Finger-Nose-Finger Test normal.     Gait: Gait is intact. Gait normal.     Deep Tendon Reflexes:     Reflex Scores:      Patellar reflexes are 2+ on the right side and 2+ on the left side. Psychiatric:        Attention and Perception: Attention normal.        Mood and Affect: Mood and affect normal.        Speech: Speech normal.        Behavior: Behavior normal. Behavior is cooperative.           Assessment & Plan:    Encounter Diagnoses  Name Primary?   Encounter for annual health examination Yes   Hypothyroidism, unspecified type     -Will check tsh.  Will refill levothyroxine when review results -Pt encouraged to continue healthy lifestyle including regular exercise -Pt reminded of need to update enrollment which expired in 2023 -Pt will follow up in one year.  She is to contact office sooner prn

## 2023-11-04 ENCOUNTER — Ambulatory Visit: Payer: Self-pay | Admitting: Physician Assistant

## 2023-11-04 ENCOUNTER — Encounter: Payer: Self-pay | Admitting: Physician Assistant

## 2023-11-04 VITALS — BP 108/71 | HR 71 | Temp 98.2°F | Wt 123.0 lb

## 2023-11-04 DIAGNOSIS — K219 Gastro-esophageal reflux disease without esophagitis: Secondary | ICD-10-CM

## 2023-11-04 DIAGNOSIS — R142 Eructation: Secondary | ICD-10-CM

## 2023-11-04 DIAGNOSIS — N926 Irregular menstruation, unspecified: Secondary | ICD-10-CM

## 2023-11-04 DIAGNOSIS — R1013 Epigastric pain: Secondary | ICD-10-CM

## 2023-11-04 LAB — POCT URINALYSIS DIPSTICK
Bilirubin, UA: NEGATIVE
Blood, UA: NEGATIVE
Glucose, UA: NEGATIVE
Leukocytes, UA: NEGATIVE
Nitrite, UA: NEGATIVE
Protein, UA: NEGATIVE
Spec Grav, UA: 1.015 (ref 1.010–1.025)
Urobilinogen, UA: 2 U/dL — AB
pH, UA: 8 (ref 5.0–8.0)

## 2023-11-04 LAB — POCT URINE PREGNANCY: Preg Test, Ur: NEGATIVE

## 2023-11-04 MED ORDER — FAMOTIDINE 20 MG PO TABS: 20.0000 mg | ORAL_TABLET | Freq: Two times a day (BID) | ORAL | 1 refills | Status: AC

## 2023-11-04 NOTE — Progress Notes (Signed)
 BP 108/71   Pulse 71   Temp 98.2 F (36.8 C)   Wt 123 lb (55.8 kg)   SpO2 98%   BMI 24.84 kg/m    Subjective:    Patient ID: Erika Stein, female    DOB: 08/24/1995, 28 y.o.   MRN: [id number]  HPI: Erika Stein is a 28 y.o. female presenting on 11/04/2023 for Bloated (for past 2 days. Pt states she has been having pain un upper mid abd. Pt states she has been belching several times during the day. When she eats she feels full quickly and has abd pain worse when eating.  Pt states she has tried ginger ale, hot tea, and baking soda with lemon which have not worked.)   HPI  Stage manager Complaint  Patient presents with   Bloated    for past 2 days. Pt states she has been having pain un upper mid abd. Pt states she has been belching several times during the day. When she eats she feels full quickly and has abd pain worse when eating.  Pt states she has tried ginger ale, hot tea, and baking soda with lemon which have not worked.    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 Her Last BM was yesterday; it was normal and regular.    Relevant past medical, surgical, family and social history reviewed and updated as indicated. Interim medical history since our last visit reviewed. Allergies and medications reviewed and updated.   Current Outpatient Medications:    etonogestrel-ethinyl estradiol (NUVARING) 0.12-0.015 MG/24HR vaginal ring, Place 1 each vaginally every 28 (twenty-eight) days. Insert vaginally and leave in place for 3 consecutive weeks, then remove for 1 week., Disp: , Rfl:    levothyroxine (SYNTHROID) 25 MCG tablet, TAKE 1 TABLET BY MOUTH ONCE DAILY *TOME UNA TABLETA POR BOCA DIARIA*, Disp: 30 tablet, Rfl: 6   VITAMIN D, CHOLECALCIFEROL, PO, Take by mouth., Disp: , Rfl:     Review of Systems  Per  HPI unless specifically indicated above     Objective:    BP 108/71   Pulse 71   Temp 98.2 F (36.8 C)   Wt 123 lb (55.8 kg)   SpO2 98%   BMI 24.84 kg/m   Wt Readings from Last 3 Encounters:  11/04/23 123 lb (55.8 kg)  05/14/23 119 lb (54 kg)  04/16/22 114 lb 9.6 oz (52 kg)    Physical Exam Vitals reviewed.  Constitutional:      General: She is not in acute distress.    Appearance: She is well-developed. She is not toxic-appearing.  HENT:     Head: Normocephalic and atraumatic.  Cardiovascular:     Rate and Rhythm: Normal rate and regular rhythm.  Pulmonary:     Effort: Pulmonary effort is normal.     Breath sounds: Normal breath sounds.  Abdominal:     General: Bowel sounds are normal.     Palpations: Abdomen is soft. There is no splenomegaly, mass or pulsatile mass.     Tenderness: There is no abdominal tenderness. There is no guarding or rebound.  Musculoskeletal:     Cervical back: Neck supple.     Right lower leg: No edema.     Left lower leg: No edema.  Lymphadenopathy:  Cervical: No cervical adenopathy.  Skin:    General: Skin is warm and dry.  Neurological:     Mental Status: She is alert and oriented to person, place, and time.  Psychiatric:        Behavior: Behavior normal.     UA - [facility] -       Assessment & Plan:   Encounter Diagnoses  Name Primary?   Gastroesophageal reflux disease, unspecified whether esophagitis present Yes   Epigastric pain    Menstrual period late    Burping        Rx pepcid Pt counseled and given reading instructions to reduce acid/gas -pt to RTO for worsening or new symptoms or if not resolved in 2 weeks -otherwise follow up a scheduled

## 2023-11-04 NOTE — Patient Instructions (Signed)
 Limit Carbonated Beverages: Avoid sodas and sparkling water.  Eat Slowly: Chew food thoroughly and avoid swallowing air.  Avoid Certain Foods: Some foods may trigger gas and bloating, such as dairy, high-carbohydrate foods, or beans.  Reduce Chewing Gum and Hard Candies: These can cause you to swallow more air.  Maintain a Healthy Weight: Excess weight can put pressure on the stomach and worsen acid reflux.  Do not eat last 3 hours prior to bed. Avoid spicy or greasy foods

## 2023-12-31 ENCOUNTER — Telehealth: Payer: Self-pay

## 2023-12-31 NOTE — Telephone Encounter (Signed)
 Attempted follow up call to [facility] client today. No answer, left message requesting return call.   Kris Pester RN [facility]

## 2024-04-29 ENCOUNTER — Telehealth: Payer: Self-pay

## 2024-04-30 NOTE — Telephone Encounter (Signed)
 Had received a call from [facility] staff asking about which Primary Care Provider client is going to stay with. She had an upcoming appointment with [facility] this month and was last seen in April at [facility] but she was also dual enrolled with [facility] also from [facility] in May and was seen in May at [facility] and has had future appointments and upcoming appointments with [facility] scheduled.  [facility] staff will attempt to contact and I have also attempted to contact with no answer and left voicemail to discuss not being able to have 2 primary cares and if she changes to contact the practice she is leaving in order to cancel appointments and change name of PCP.    Erika Stein Skeen RN [facility] [employer] [facility]

## 2024-05-12 ENCOUNTER — Ambulatory Visit: Payer: Self-pay | Admitting: Physician Assistant
